# Patient Record
Sex: Male | Born: 1965 | Race: Black or African American | Hispanic: No | Marital: Married | Smoking: Former smoker
Health system: Southern US, Community
[De-identification: ages and names within clinical notes are randomized; demographics above are authoritative.]

## PROBLEM LIST (undated history)

## (undated) DIAGNOSIS — I10 Essential (primary) hypertension: Secondary | ICD-10-CM

## (undated) DIAGNOSIS — M1990 Unspecified osteoarthritis, unspecified site: Secondary | ICD-10-CM

## (undated) HISTORY — DX: Essential (primary) hypertension: I10

## (undated) HISTORY — DX: Unspecified osteoarthritis, unspecified site: M19.90

---

## 1997-11-08 ENCOUNTER — Emergency Department (HOSPITAL_COMMUNITY): Admission: EM | Admit: 1997-11-08 | Discharge: 1997-11-08 | Payer: Self-pay | Admitting: Emergency Medicine

## 2006-03-27 HISTORY — PX: OTHER SURGICAL HISTORY: SHX169

## 2006-04-22 ENCOUNTER — Emergency Department (HOSPITAL_COMMUNITY): Admission: EM | Admit: 2006-04-22 | Discharge: 2006-04-22 | Payer: Self-pay | Admitting: Emergency Medicine

## 2006-04-30 ENCOUNTER — Ambulatory Visit (HOSPITAL_COMMUNITY): Admission: RE | Admit: 2006-04-30 | Discharge: 2006-05-01 | Payer: Self-pay | Admitting: Orthopaedic Surgery

## 2008-05-12 IMAGING — CR DG ANKLE COMPLETE 3+V*R*
3 series · 3 of 3 positions shown · non-contrast
Comparison: none

CLINICAL DATA: Twisted ankle

Right ankle three-view:
Oblique minimally displaced fracture of the distal fibula at the level of the
ankle mortise. There is adjacent soft tissue swelling. Ankle mortise remains
intact. No other fracture or bone abnormality is evident.

[t ankle joint ap right]
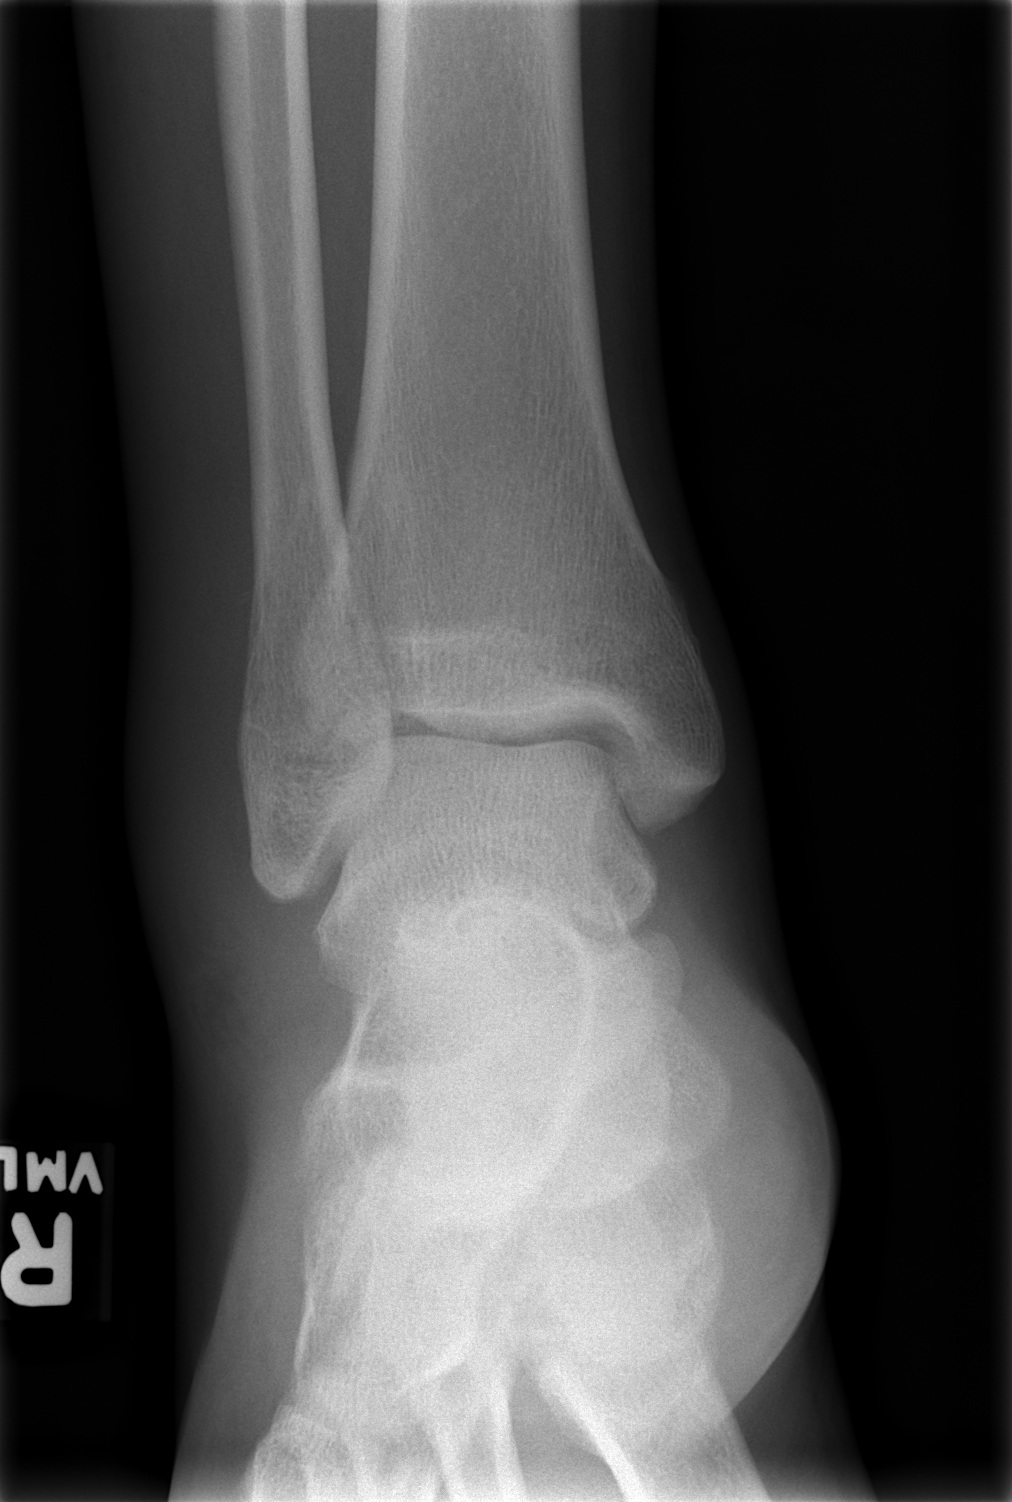

[t ankle joint oblique right]
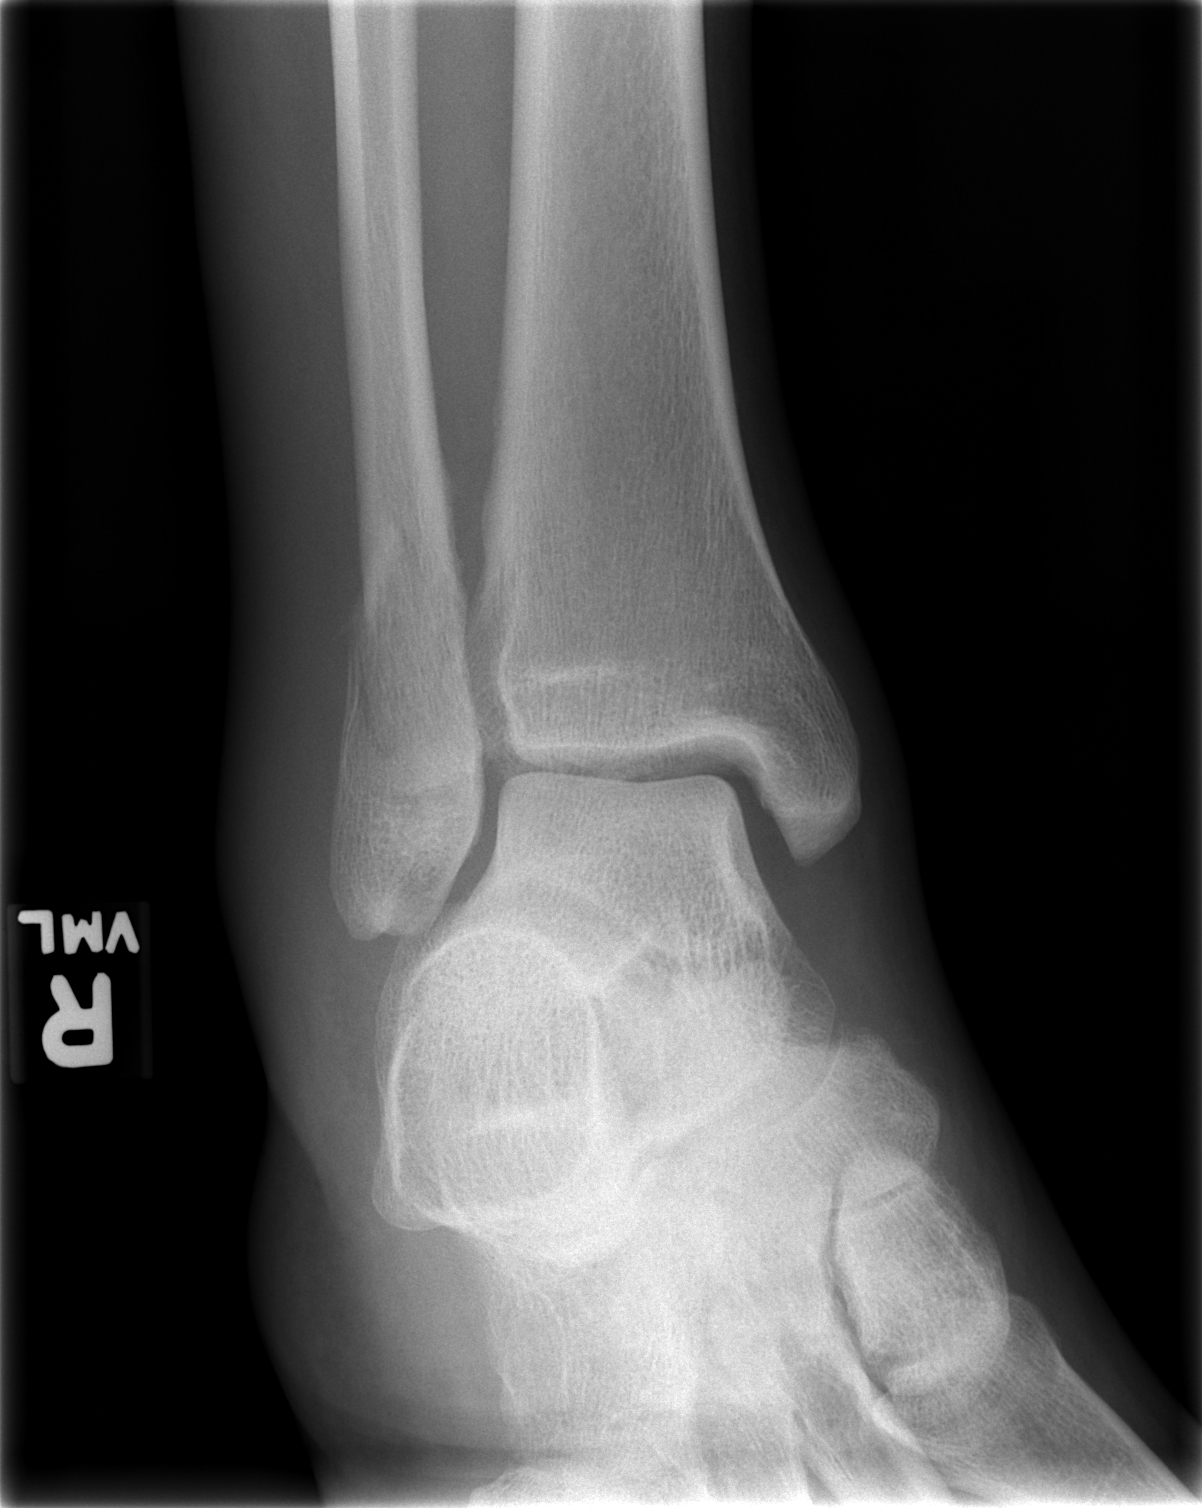

[t ankle joint lat right]
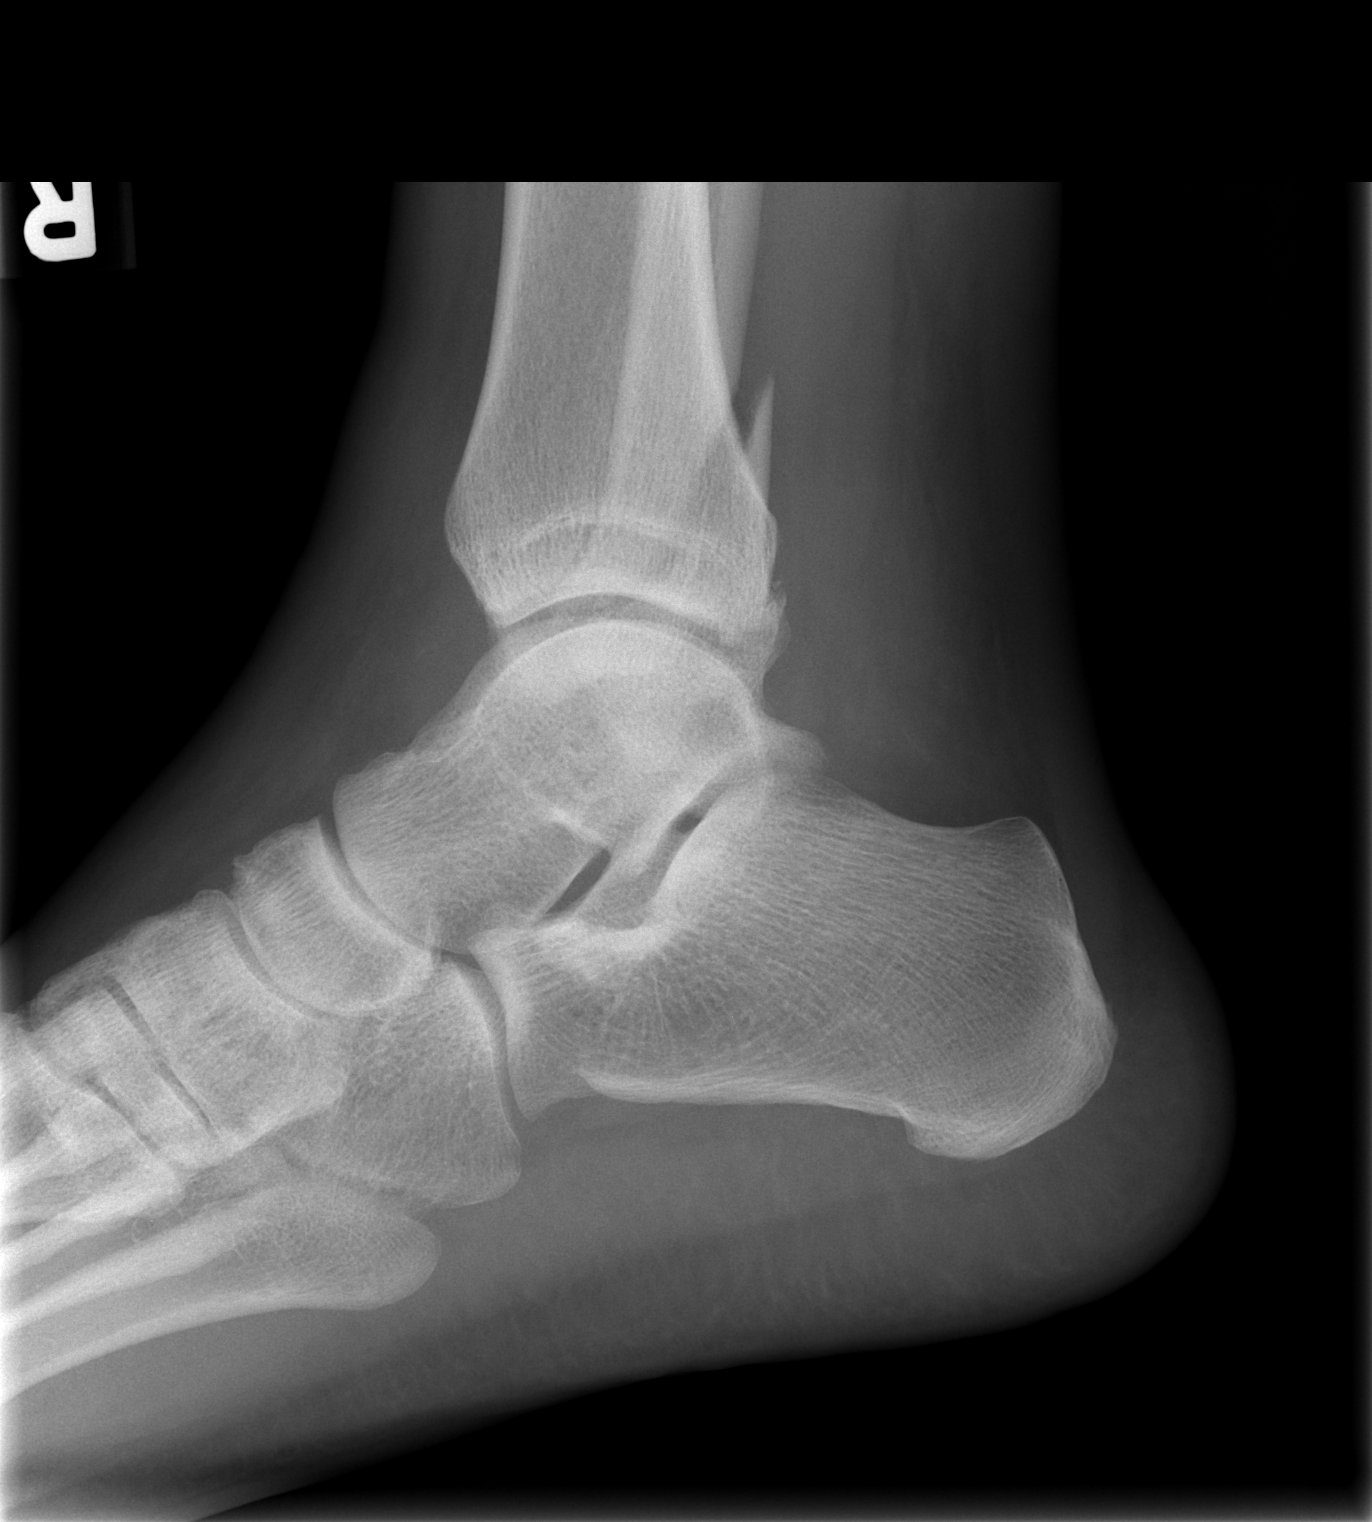

[3 of 3 positions shown; findings below may reference images not displayed]

IMPRESSION: 1. Distal fibular fracture

## 2009-03-27 DIAGNOSIS — I10 Essential (primary) hypertension: Secondary | ICD-10-CM

## 2009-03-27 HISTORY — DX: Essential (primary) hypertension: I10

## 2009-07-15 ENCOUNTER — Emergency Department (HOSPITAL_COMMUNITY): Admission: EM | Admit: 2009-07-15 | Discharge: 2009-07-15 | Payer: Self-pay | Admitting: Emergency Medicine

## 2009-10-25 HISTORY — PX: HERNIA REPAIR: SHX51

## 2009-11-22 ENCOUNTER — Ambulatory Visit (HOSPITAL_COMMUNITY): Admission: RE | Admit: 2009-11-22 | Discharge: 2009-11-22 | Payer: Self-pay | Admitting: General Surgery

## 2009-11-24 ENCOUNTER — Ambulatory Visit: Payer: Self-pay | Admitting: Internal Medicine

## 2009-11-24 DIAGNOSIS — I10 Essential (primary) hypertension: Secondary | ICD-10-CM | POA: Insufficient documentation

## 2009-12-14 ENCOUNTER — Ambulatory Visit: Payer: Self-pay | Admitting: Internal Medicine

## 2009-12-14 ENCOUNTER — Ambulatory Visit: Payer: Self-pay | Admitting: Cardiology

## 2009-12-14 ENCOUNTER — Telehealth: Payer: Self-pay | Admitting: Internal Medicine

## 2009-12-14 DIAGNOSIS — R079 Chest pain, unspecified: Secondary | ICD-10-CM | POA: Insufficient documentation

## 2009-12-14 LAB — CONVERTED CEMR LAB
BUN: 9 mg/dL (ref 6–23)
Chloride: 104 meq/L (ref 96–112)
Creatinine, Ser: 0.9 mg/dL (ref 0.4–1.5)
Glucose, Bld: 87 mg/dL (ref 70–99)
Sodium: 138 meq/L (ref 135–145)

## 2009-12-28 ENCOUNTER — Telehealth: Payer: Self-pay | Admitting: Internal Medicine

## 2009-12-31 ENCOUNTER — Telehealth: Payer: Self-pay | Admitting: Internal Medicine

## 2009-12-31 ENCOUNTER — Ambulatory Visit: Payer: Self-pay | Admitting: Internal Medicine

## 2009-12-31 DIAGNOSIS — B351 Tinea unguium: Secondary | ICD-10-CM

## 2009-12-31 DIAGNOSIS — M25519 Pain in unspecified shoulder: Secondary | ICD-10-CM

## 2009-12-31 DIAGNOSIS — M549 Dorsalgia, unspecified: Secondary | ICD-10-CM | POA: Insufficient documentation

## 2010-01-05 LAB — CONVERTED CEMR LAB
ALT: 37 units/L (ref 0–53)
Albumin: 4.7 g/dL (ref 3.5–5.2)
Alkaline Phosphatase: 66 units/L (ref 39–117)
Bilirubin, Direct: 0.1 mg/dL (ref 0.0–0.3)
Calcium: 9.5 mg/dL (ref 8.4–10.5)
Creatinine, Ser: 0.83 mg/dL (ref 0.40–1.50)
Glucose, Bld: 94 mg/dL (ref 70–99)
Indirect Bilirubin: 0.7 mg/dL (ref 0.0–0.9)
Total Bilirubin: 0.8 mg/dL (ref 0.3–1.2)
Total Protein: 7.2 g/dL (ref 6.0–8.3)

## 2010-01-07 ENCOUNTER — Telehealth: Payer: Self-pay | Admitting: Internal Medicine

## 2010-01-14 ENCOUNTER — Encounter: Payer: Self-pay | Admitting: Internal Medicine

## 2010-01-24 ENCOUNTER — Telehealth: Payer: Self-pay | Admitting: Internal Medicine

## 2010-01-27 ENCOUNTER — Telehealth: Payer: Self-pay | Admitting: Internal Medicine

## 2010-02-04 ENCOUNTER — Telehealth: Payer: Self-pay | Admitting: Internal Medicine

## 2010-03-09 ENCOUNTER — Ambulatory Visit: Payer: Self-pay | Admitting: Internal Medicine

## 2010-03-11 ENCOUNTER — Telehealth: Payer: Self-pay | Admitting: Internal Medicine

## 2010-03-15 ENCOUNTER — Telehealth: Payer: Self-pay | Admitting: Internal Medicine

## 2010-03-15 ENCOUNTER — Ambulatory Visit: Payer: Self-pay | Admitting: Internal Medicine

## 2010-04-17 ENCOUNTER — Encounter: Payer: Self-pay | Admitting: Internal Medicine

## 2010-04-26 NOTE — Progress Notes (Signed)
Summary: Sporanox denied  Phone Note Other Incoming   Summary of Call: Patient sporanox has been denied. Please advise. Initial call taken by: Lucious Groves CMA,  January 27, 2010 9:45 AM  Follow-up for Phone Call        again, refer to dermatology for further treatment. Follow-up by: Nolon Rod. Paz MD,  January 28, 2010 10:09 AM  Additional Follow-up for Phone Call Additional follow up Details #1::        Patient notified that prescription has been denied and he made me aware he has already seen dermatology. No further action required.  Additional Follow-up by: Lucious Groves CMA,  January 28, 2010 2:25 PM

## 2010-04-26 NOTE — Medication Information (Signed)
Summary: Prior Authorization for Sporanox/[employer]  Prior Authorization for Sporanox/[employer]   Imported By: Lanelle Bal 01/26/2010 12:40:58  _____________________________________________________________________  External Attachment:    Type:   Image     Comment:   External Document

## 2010-04-26 NOTE — Progress Notes (Signed)
Summary: BP Med SE  Phone Note Call from Patient Call back at Home Phone [phone]   Caller: Patient Summary of Call: Patient called and LM on triage VM stating that his BP medicine (Losartan Potassium 100mg ) was causing him GI problems and he would like to have the medicine changed. Initial call taken by: Harold Barban,  January 24, 2010 1:39 PM  Follow-up for Phone Call        Spoke with patient and he said he researched the medicine and said he had some side effects it stated this medicine could cause. 45-60 mins after taking medicine he gets bad abd cramps. Please advise.  Follow-up by: Harold Barban,  January 24, 2010 1:42 PM  Additional Follow-up for Phone Call Additional follow up Details #1::        please be sure he's not having any rash, lip or tongue swelling.  If he is not , then discontinue losartan, give samples of micardis 80 mg one p.o. q. day nurse visit in 3 weeks for a BP check and BMP.    Additional Follow-up by: Nolon Rod. Paz MD,  January 24, 2010 4:03 PM    Additional Follow-up for Phone Call Additional follow up Details #2::    left message for pt to call back. Army Fossa CMA  January 24, 2010 4:12 PM   Additional Follow-up for Phone Call Additional follow up Details #3:: Details for Additional Follow-up Action Taken: Pt is not having any swelling or rashes, he will stop by and get meds and come in for a BP check in 3 weeks.  Additional Follow-up by: Army Fossa CMA,  January 24, 2010 4:53 PM  New/Updated Medications: MICARDIS 80 MG TABS (TELMISARTAN) 1 by mouth daily.

## 2010-04-26 NOTE — Progress Notes (Signed)
Summary: PRIOR AUTH  Phone Note Refill Request Call back at [phone] Message from:  Pharmacy on January 07, 2010 3:35 PM  Refills Requested: Medication #1:  SPORANOX 100 MG CAPS 2 tablets daily for 12 weeks.   Dosage confirmed as above?Dosage Confirmed   Supply Requested: 1 month PRIOR AUTH NEEDED: [phone] ID [id number]  Next Appointment Scheduled: 01/28/10 Initial call taken by: Lavell Islam,  January 07, 2010 3:35 PM  Follow-up for Phone Call        Not available via [email or web address]. Called [employer] and was directed to call [employer] at [phone]. Forms requested Ref # [id number]. Follow-up by: Lucious Groves CMA,  January 10, 2010 9:31 AM  Additional Follow-up for Phone Call Additional follow up Details #1::        Forms requested again. Cesar Rogerson CMA  January 14, 2010 8:51 AM   Forms were completed and faxed on the 21st. Will await insurance company reply. Derrik Mceachern CMA  January 17, 2010 12:05 PM      Appended Document: PRIOR AUTH called to check on status, prior authorization still pending.

## 2010-04-26 NOTE — Progress Notes (Signed)
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 The findings are  mild  and there is no clear guidelines as to repeat a CT with contrast on this patient (and expose him to more radiation) . Because of this I like  to see the patient in 3 months, check a CBC and be sure he does not have nocturnal fevers, weight loss, cough et Karie Soda. I spoke with the patient, he is in agreement to come back within 3 months. At this point he is doing well and symptom free Jose E. Paz MD  December 28, 2009 9:23 AM

## 2010-04-26 NOTE — Assessment & Plan Note (Signed)
Summary: NEW TO ESTAB/BP PROBLEM//PH   Vital Signs:  Patient profile:   45 year old male Height:      71 inches Weight:      207 pounds BMI:     28.97 Pulse rate:   83 / minute Pulse rhythm:   regular BP sitting:   138 / 88  (left arm) Cuff size:   regular  Vitals Entered By: Army Fossa CMA (November 24, 2009 8:09 AM) CC: New to establish- BP Has been elevated Comments highest- 150/101  had hernia repair on Monday during surgery BP was 147/97   History of Present Illness: here with his wife He was diagnosed with hypertension several months ago For while he took Azor  with good results, his blood pressure was in the 120/80 range and he did not have side effects. He took all the samples he was provided and then stopped taking it. His blood pressure has been elevated recently several times. See nurse's notes  Labs from 11/18/09(pre-op) reviewed:    Sodium (NA)                              142               135-145          mEq/L  Potassium (K)                            4.0               3.5-5.1          mEq/L  Chloride                                 108               96-112           mEq/L  CO2                                      30                19-32            mEq/L  Glucose                                  88                70-99            mg/dL  BUN                                      4          l      6-23             mg/dL  Creatinine                               0.94              0.4-1.5  mg/dL    Bilirubin, Total                         1.0               0.3-1.2          mg/dL  Alkaline Phosphatase                     60                39-117           U/L  SGOT (AST)                               31                0-37             U/L  SGPT (ALT)                               35                0-53             U/L  Total  Protein                           6.8               6.0-8.3          g/dL  Albumin-Blood                            4.4                3.5-5.2          g/dL  Calcium                                  9.6                    WBC                                      3.6        l      4.0-10.5         K/uL  RBC                                      4.56              4.22-5.81        MIL/uL  Hemoglobin (HGB)                         13.1              13.0-17.0        g/dL  Hematocrit (HCT)  38.4       l      39.0-52.0        %  Platelet Count (PLT)                     162               150-400          K/uL  Preventive Screening-Counseling & Management  Alcohol-Tobacco     Smoking Status: never  Caffeine-Diet-Exercise     Does Patient Exercise: no      Drug Use:  no.    Allergies (verified): No Known Drug Allergies  Past History:  Past Medical History: Asthma--dx as a child, no symptoms x years  HTN--dx 2011 Hypertension  Past Surgical History: Hernia Repair 10-2009 Broken Ankle (had surgery) -2008  Family History:  Hypertension--  F M MI-- no CHF-- M Strokes-- M x2 DM -- M colon ca--no prostate ca--no  Social History: married 5 boys Never Smoked Alcohol use-no Drug use-no Regular exercise-no  Smoking Status:  never Drug Use:  no Does Patient Exercise:  no  Review of Systems CV:  Denies chest pain or discomfort and swelling of feet. Resp:  Denies cough and shortness of breath. GI:  Denies diarrhea, nausea, and vomiting. Neuro:  rarely has a HA.  Physical Exam  General:  alert, well-developed, and well-nourished.   Neck:  no masses and no thyromegaly.   Lungs:  normal respiratory effort, no intercostal retractions, no accessory muscle use, and normal breath sounds.   Heart:  normal rate, regular rhythm, no murmur, and no gallop.   Abdomen:  unable to lay down and the table due to recent surgery Extremities:  no lower extremity edema Psych:  Oriented X3, memory intact for recent and remote, normally interactive, good eye contact, not anxious appearing, and not  depressed appearing.     Impression & Recommendations:  Problem # 1:  HYPERTENSION (ICD-401.9)  hypertension diagnosed a few months ago who is BP in the 150s documented several times BP today is actually normal, he has been resting for the last 2 days due to recent surgery recent labs within normal Discussed the treatment of hypertension: -low-salt diet  -exercise -medication will start with  losartan 50 mg , watch for side effects and over controlled BP Follow up in 3 weeks. Consider add  amlodipine if needed BP today: 138/88  His updated medication list for this problem includes:    Losartan Potassium 50 Mg Tabs (Losartan potassium) ..... One by mouth daily  Complete Medication List: 1)  Norco 5-325 Mg Tabs (Hydrocodone-acetaminophen) .... Q4 hrs. 2)  Losartan Potassium 50 Mg Tabs (Losartan potassium) .... One by mouth daily  Patient Instructions: 1)  Please schedule a follow-up appointment in 3  weeks 2)  call if you feel weak, dizzy. 3)  Check your blood pressure 2 or 3 times a week. If it is more than 140/85  or less than  110/60 consistently,please let us know  Prescriptions: LOSARTAN POTASSIUM 50 MG TABS (LOSARTAN POTASSIUM) one by mouth daily  #30 x 1   Entered and Authorized by:   Elita Quick E. Paz MD   Signed by:   Nolon Rod. Paz MD on 11/24/2009   Method used:   Print then Give to Patient   RxID:   [id number]    Preventive Care Screening  Last Tetanus Booster:    Date:  03/28/2003    Results:  Historical

## 2010-04-26 NOTE — Assessment & Plan Note (Signed)
Summary: bp check/shoulder pain/cbs   Vital Signs:  Patient profile:   45 year old male Weight:      203 pounds Pulse rate:   107 / minute Pulse rhythm:   regular BP sitting:   132 / 84  (left arm) Cuff size:   regular  Vitals Entered By: Army Fossa CMA (December 31, 2009 1:30 PM) CC: BP check, discuss shoulder pain Comments pain off and on for years [facility] [address] declines flu shot   History of Present Illness: routine followup  We recently increased his BP medicine  good medication compliance, no side effects that he can tell, ambulatory BPs in the 120/70. Feels great  Also has onychomycosis for years, has tried all over-the-counter medicines without defect. Likes to be treated  Chronic right shoulder pain for years, he feels a "pop" when he elevates his arm  Chronic low back pain for years as well, pain is worse when he lifts heavy stuff, better with rest, occ.  he has radiation and pain in the left leg. In the past he was evaluated ,a  MRI was done, surgery was recommended but he never followed through. He is tired of the pain and like something done   ROS No fever No bladder or bowel incontinence  Allergies: No Known Drug Allergies  Past History:  Past Medical History: Reviewed history from 12/14/2009 and no changes required. Asthma--dx as a child, no symptoms x years  HTN--dx 2011  Past Surgical History: Reviewed history from 11/24/2009 and no changes required. Hernia Repair 10-2009 Broken Ankle (had surgery) -2008  Social History: Reviewed history from 11/24/2009 and no changes required. married 5 boys Never Smoked Alcohol use-no Drug use-no Regular exercise-no  Physical Exam  General:  alert, well-developed, and well-nourished.   Lungs:  normal respiratory effort, no intercostal retractions, no accessory muscle use, and normal breath sounds.   Heart:  normal rate, regular rhythm, and no murmur.   Msk:  slightly tender at the lower  back Extremities:  shoulders without deformities Pain of the right shoulder with elevation of the arm Neurologic:  lower extremity DTRs and motor: Symmetric No straight leg test Skin:  both great toe nails  quite  thick and dystrophic, the other nails are essentially unaffected. No maceration between the toes   Impression & Recommendations:  Problem # 1:  HYPERTENSION (ICD-401.9) BP well controlled, continue with present care His updated medication list for this problem includes:    Losartan Potassium 100 Mg Tabs (Losartan potassium) ..... One by mouth daily  BP today: 132/84 Prior BP: 140/88 (12/14/2009)  Labs Reviewed: K+: 3.9 (12/14/2009) Creat: : 0.9 (12/14/2009)     Orders: Venipuncture (09811)  Problem # 2:  ONYCHOMYCOSIS (ICD-110.1) dystrophic nails most likely onychomycosis Check LFTs Start Sporanox daily for 12 weeks aware of potential liver problems, printed material provided regards Sporanox from "up-to-date" It may affect losartan, he needs to monitor his BP   His updated medication list for this problem includes:    Sporanox 100 Mg Caps (Itraconazole) .Marland Kitchen... 2 tablets daily for 12 weeks  Problem # 3:  SHOULDER PAIN (ICD-719.41)  chronic pain, refer to orthopedic surgery The following medications were removed from the medication list:    Norco 5-325 Mg Tabs (Hydrocodone-acetaminophen) ..... Q4 hrs.  Orders: Orthopedic Referral (Ortho)  Problem # 4:  BACK PAIN (ICD-724.5)  chronic pain, some radicular features, in the past he had MRI, was offered surgery but did not follow through. Refer to orthopedic surgery  The following medications were removed from the medication list:    Norco 5-325 Mg Tabs (Hydrocodone-acetaminophen) ..... Q4 hrs.  Orders: Orthopedic Referral (Ortho)  Complete Medication List: 1)  Losartan Potassium 100 Mg Tabs (Losartan potassium) .... One by mouth daily 2)  Sporanox 100 Mg Caps (Itraconazole) .... 2 tablets daily for 12  weeks  Patient Instructions: 1)  start Sporanox 2 tablets daily for 3 months 2)  came back  4 weeks after you start sporanox: LFTs , dx onychomicosis 3)  Check your blood pressure 2 or 3 times a week. If it is more than 140/85 consistently,please let us know  4)  Please schedule a follow-up appointment in 4 months .  Prescriptions: LOSARTAN POTASSIUM 100 MG TABS (LOSARTAN POTASSIUM) one by mouth daily  #90 x 2   Entered and Authorized by:   Elita Quick E. Paz MD   Signed by:   Nolon Rod. Paz MD on 12/31/2009   Method used:   Print then Give to Patient   RxID:   [id number] SPORANOX 100 MG CAPS (ITRACONAZOLE) 2 tablets daily for 12 weeks  #60 x 2   Entered and Authorized by:   Nolon Rod. Paz MD   Signed by:   Nolon Rod. Paz MD on 12/31/2009   Method used:   Print then Give to Patient   RxID:   [id number]

## 2010-04-26 NOTE — Progress Notes (Signed)
Summary: Med Concerns  Phone Note Call from Patient Call back at Home Phone [phone]   Caller: Patient Summary of Call: Patient called to say that his insurance will not cover Sporanox and he needs an alternative  Dr.Paz please advise [facility] [address] Initial call taken by: Shonna Chock CMA,  December 31, 2009 3:41 PM  Follow-up for Phone Call        Per MD refer to dermatology. Patient notified. Paola Flynt CMA  December 31, 2009 3:50 PM   New Problems: ONYCHOMYCOSIS (ICD-110.1)   New Problems: ONYCHOMYCOSIS (ICD-110.1)

## 2010-04-26 NOTE — Assessment & Plan Note (Signed)
Summary: 3 WEEK FOLLOWUP/KN   Vital Signs:  Patient profile:   45 year old male Weight:      201.50 pounds Pulse rate:   98 / minute Pulse rhythm:   regular BP sitting:   140 / 88  (left arm) Cuff size:   regular  Vitals Entered By: Army Fossa CMA (December 14, 2009 10:59 AM) CC: Follow up on BP- fasting Comments pharm- [facility] [address] declines flu shot   History of Present Illness: followup As far as the hypertension, he started losartan, good  medication compliance and tolerance. Ambulatory BPs in the 140/86 range No side effects that he can tell  Also complained of chest pain, last week he had a steady anterior chest pain without radiation for 3 days The pain was worse with deep breaths or moving his torso Pain is now resolved. There was no associated nausea, vomiting, difficulty breathing.  ROS following a low-salt diet He had recently a hernia repair, still quite sore in the area, some swelling. He just saw surgery, and they recommend 3 more weeks of resting and limited  lifting No fever  no wheezing or cough No pain or swelling in his calves  admits to  some heartburn last week No palpitations   Current Medications (verified): 1)  Norco 5-325 Mg Tabs (Hydrocodone-Acetaminophen) .... Q4 Hrs. 2)  Losartan Potassium 50 Mg Tabs (Losartan Potassium) .... One By Mouth Daily  Allergies (verified): No Known Drug Allergies  Past History:  Past Medical History: Asthma--dx as a child, no symptoms x years  HTN--dx 2011  Past Surgical History: Reviewed history from 11/24/2009 and no changes required. Hernia Repair 10-2009 Broken Ankle (had surgery) -2008  Social History: Reviewed history from 11/24/2009 and no changes required. married 5 boys Never Smoked Alcohol use-no Drug use-no Regular exercise-no  Physical Exam  General:  alert, well-developed, and well-nourished.   Chest Wall:  nontender to palpation Lungs:  normal respiratory effort,  no intercostal retractions, no accessory muscle use, and normal breath sounds.   Heart:  normal rate, regular rhythm, and no murmur.   Abdomen:  soft, non-tender, no distention, and no masses.   Extremities:  no lower extremity edema, calves  are soft, symmetric, not tender   Impression & Recommendations:  Problem # 1:  HYPERTENSION (ICD-401.9) close to his goal, I would like to see his BP closer to 120 Increase losartan to 100 mg Labs His updated medication list for this problem includes:    Losartan Potassium 100 Mg Tabs (Losartan potassium) ..... One by mouth daily  BP today: 140/88 Prior BP: 138/88 (11/24/2009)  Orders: Venipuncture (14782) TLB-BMP (Basic Metabolic Panel-BMET) (80048-METABOL) Specimen Handling (95621)  Problem # 2:  CHEST PAIN (ICD-786.50)  three-day history of chest pain last week, status post surgery last month, he has been resting more than usual DDX includes CAD, PE, GERD, others pain is now  resolved Plan: EKG--normal sinus rhythm, normal axis, no acute changes; early repolarization? ( also seen at the EKG at the hospital on August) Chest x-ray D-dimer  ER if symptoms resurface  Orders: T-D-Dimer Fibrin Derivatives Quantitive (30865-78469) EKG w/ Interpretation (93000) Specimen Handling (62952) T-2 View CXR (71020TC) Radiology Referral (Radiology)  Complete Medication List: 1)  Norco 5-325 Mg Tabs (Hydrocodone-acetaminophen) .... Q4 hrs. 2)  Losartan Potassium 100 Mg Tabs (Losartan potassium) .... One by mouth daily  Patient Instructions: 1)  increase losartan 2)  get a chest x-ray 3)  came back in one month Prescriptions: LOSARTAN POTASSIUM  100 MG TABS (LOSARTAN POTASSIUM) one by mouth daily  #30 x 3   Entered and Authorized by:   Elita Quick E. Shone Leventhal MD   Signed by:   Nolon Rod. Temesgen Weightman MD on 12/14/2009   Method used:   Print then Give to Patient   RxID:   [id number]

## 2010-04-26 NOTE — Progress Notes (Signed)
Summary: CT Results   Phone Note Outgoing Call   Summary of Call: advise  patient, CT negative for pulmonary emboli. Recommend observation. To call if chest pain re-surface [name] [name] MD  December 14, 2009 4:19 PM   Follow-up for Phone Call        Left message for pt to call back. Army Fossa CMA  December 14, 2009 4:23 PM   Additional Follow-up for Phone Call Additional follow up Details #1::        Patient is aware. Will call back if chest pain comes back. Additional Follow-up by: Harold Barban,  December 15, 2009 10:51 AM

## 2010-04-26 NOTE — Progress Notes (Signed)
Summary: Triage Call- BP Medication  Phone Note Call from Patient   Caller: Patient Summary of Call: Left message on Triage stating that he was originally on a BP Med that didn't work and gave him stomach pain. The 2nd medication makes him sleepy " I just can't function." Pt is requesting to be put back on the original BP medication and then if he has stomach pain he will need something for that. Please advise. [phone] Initial call taken by: Lavell Islam,  February 04, 2010 2:15 PM  Follow-up for Phone Call        Left message for pt to call back, pt did not answer. Unable to get more information. Will try on Monday. Follow-up by: Army Fossa CMA,  February 04, 2010 5:07 PM  Additional Follow-up for Phone Call Additional follow up Details #1::        Patient notes that the above is correct, the original BP med (losartan) gave him stomach pain/cramps. Micardis makes him groggy. Please advise of alternative.   (uses [facility] [address]). Patient notes that he did not have any problems with Azor. Additional Follow-up by: Lucious Groves CMA,  February 07, 2010 2:05 PM    Additional Follow-up for Phone Call Additional follow up Details #2::    go back to azor 5-20mg  1 a day, call #30, 1 RF ov 4 weeks Magdalina Whitehead E. Matheau Orona MD  February 07, 2010 4:59 PM    Patient notified and appt scheduled. Criston Chancellor CMA  February 08, 2010 8:47 AM   New/Updated Medications: AZOR 5-20 MG TABS (AMLODIPINE-OLMESARTAN) 1 by mouth once daily Prescriptions: AZOR 5-20 MG TABS (AMLODIPINE-OLMESARTAN) 1 by mouth once daily  #30 x 1   Entered by:   Lucious Groves CMA   Authorized by:   Nolon Rod. Sabri Teal MD   Signed by:   Lucious Groves CMA on 02/08/2010   Method used:   Electronically to        [id number] (retail)       [address] [place], [place]  [address]       Ph: [phone]       Fax: [phone]   RxID:   [id number]

## 2010-04-28 NOTE — Progress Notes (Signed)
Summary: Missed appt  Phone Note Call from Patient Call back at Home Phone [phone]   Summary of Call: Patient has not shown up for appt. I called to check on him, left message on voicemail to call back to office.  Lucious Groves CMA,  March 15, 2010 1:08 PM  No return call, left message on voicemail to call back to office to reschedule. Christepher Melchior CMA  March 16, 2010 2:39 PM   FYI--No return call and patient has not re-scheduled appt. Sabir Charters CMA  March 18, 2010 4:03 PM

## 2010-04-28 NOTE — Progress Notes (Signed)
Summary: Call request--Discuss personal issue  Phone Note Call from Patient Call back at Home Phone [phone]   Summary of Call: Patient is having some very personal issues that are tearing up his nerves. I offered patient 2 different appts today (one with [facility] or with [facility]) and patient declined stating that he has to work. I did schedule appt next week with Talena Neira.   Patient made me aware that he is having a great deal of stress with family issue, and is having HA, but I could not get any additional info from him to be able to assist him better. Patient would like MD to call him on the phone to discuss the issues b/c he feels that he cannot wait until next week. Initial call taken by: Lucious Groves CMA,  March 11, 2010 10:34 AM  Follow-up for Phone Call        patient quite distressed, his son is having problems at school. Denies suicidal or homicidal ideas. Patient is counseled knows to call if he ever develops suicidal or homicidal ideas We'll call  clonazepam 0.5 mg Half to one tablet 3 times a day as needed for anxiety #30 send Rx to his pharmacy  will come back Follow-up by: Assata Juncaj E. Nahima Ales MD,  March 11, 2010 10:54 AM  Additional Follow-up for Phone Call Additional follow up Details #1::        Completed. Additional Follow-up by: Lucious Groves CMA,  March 11, 2010 11:24 AM    New/Updated Medications: CLONAZEPAM 0.5 MG TBDP (CLONAZEPAM) Half to one tablet 3 times a day as needed for anxiety Prescriptions: CLONAZEPAM 0.5 MG TBDP (CLONAZEPAM) Half to one tablet 3 times a day as needed for anxiety  #30 x 0   Entered by:   Lucious Groves CMA   Authorized by:   Nolon Rod. Khameron Gruenwald MD   Signed by:   Lucious Groves CMA on 03/11/2010   Method used:   Printed then faxed to ...       [facility] [address] #[id number]* (retail)       [address] [place], [place]  [address]       Ph: [phone]       Fax: [phone]   RxID:   [id number]

## 2010-05-27 ENCOUNTER — Ambulatory Visit: Payer: BC Managed Care – PPO | Admitting: Internal Medicine

## 2010-05-27 ENCOUNTER — Ambulatory Visit (INDEPENDENT_AMBULATORY_CARE_PROVIDER_SITE_OTHER): Payer: BC Managed Care – PPO | Admitting: Internal Medicine

## 2010-05-27 ENCOUNTER — Encounter: Payer: Self-pay | Admitting: Internal Medicine

## 2010-05-27 ENCOUNTER — Telehealth (INDEPENDENT_AMBULATORY_CARE_PROVIDER_SITE_OTHER): Payer: Self-pay | Admitting: *Deleted

## 2010-05-27 DIAGNOSIS — M25519 Pain in unspecified shoulder: Secondary | ICD-10-CM

## 2010-06-02 NOTE — Assessment & Plan Note (Signed)
Summary: shoulder pain   Vital Signs:  Patient profile:   45 year old male Weight:      201.4 pounds BMI:     28.19 Temp:     98.8 degrees F oral Pulse rate:   72 / minute Resp:     15 per minute BP sitting:   140 / 98  (left arm) Cuff size:   large  Vitals Entered By: Shonna Chock CMA (May 27, 2010 12:00 PM) CC: 1.) Right shoulder pain  2.)Asthma, Shoulder pain, Neck pain   CC:  1.) Right shoulder pain  2.)Asthma, Shoulder pain, and Neck pain.  History of Present Illness:      This is a 45 year old man who presents with Shoulder pain progressive over past 3 months  The patient reports numbness, weakness, &  tingling RUE > LUE .He has  R shouder stiffness, impaired ROM, and swelling, but denies redness.  The pain is located mainly  in the right shoulder.  The pain began gradually  12 months ago  after lifting 35-40  # rolls of plastic film.  The patient describes the pain as constant, dull, and burning for last 3 months.  The pain is better with NSAIDS.  The pain is worse in RLDP or with elevation of RUE.  There has been no evaluation to date. The patient denies the following associated symptoms: gait disturbance, fever, bladder dysfunction, and bowel dysfunction. He has been a weight lifter.   Current Medications (verified): 1)  Sporanox 100 Mg Caps (Itraconazole) .... 2 Tablets Daily For 12 Weeks  Allergies (verified): 1)  ! Benadryl  Physical Exam  General:  Well-developed, uncomfortable but in no acute distress; alert,appropriate and cooperative throughout examination Neck:  No deformities, masses, or tenderness noted. Some decreased ROM to R Lungs:  Normal respiratory effort, chest expands symmetrically. Lungs are clear to auscultation, no crackles or wheezes. Heart:  Normal rate and regular rhythm. S1 and S2 normal without gallop, murmur, click, rub or other extra sounds. Msk:  No deformity or scoliosis noted of thoracic or lumbar spine.   Pulses:  R and L carotid,radial  pulses are full and equal bilaterally Extremities:  No clubbing, cyanosis, edema, or deformity noted . Markedly decreased  range of motion of  R shoulder . R shoulder is lower than L with ? slight atrophy . Pain to palpation R shoulder  Neurologic:  alert & oriented X3, strength decreased R shoulder to opposition  ; DTRs symmetrical and normal.   Skin:  Intact without suspicious lesions or rashes Cervical Nodes:  No lymphadenopathy noted Axillary Nodes:  No palpable lymphadenopathy Psych:  memory intact for recent and remote, normally interactive, and good eye contact.     Impression & Recommendations:  Problem # 1:  SHOULDER PAIN, RIGHT (ICD-719.41)  acute on chronic  Orders: Orthopedic Surgeon Referral (Ortho Surgeon)  His updated medication list for this problem includes:    Hydrocodone-acetaminophen 5-500 Mg Tabs (Hydrocodone-acetaminophen) .Marland Kitchen... 1-2 every 6 hrs as needed pain  Complete Medication List: 1)  Sporanox 100 Mg Caps (Itraconazole) .... 2 tablets daily for 12 weeks 2)  Hydrocodone-acetaminophen 5-500 Mg Tabs (Hydrocodone-acetaminophen) .Marland Kitchen.. 1-2 every 6 hrs as needed pain  Patient Instructions: 1)  No weight lifting until cleared by Orthopedist Prescriptions: HYDROCODONE-ACETAMINOPHEN 5-500 MG TABS (HYDROCODONE-ACETAMINOPHEN) 1-2 every 6 hrs as needed pain  #30 x 0   Entered and Authorized by:   Marga Melnick MD   Signed by:   Marga Melnick MD  on 05/27/2010   Method used:   Print then Give to Patient   RxID:   [id number]    Orders Added: 1)  Est. Patient Level III [86578] 2)  Orthopedic Surgeon Referral [Ortho Surgeon]

## 2010-06-07 NOTE — Progress Notes (Signed)
Summary: inhaler (lmom 3/5)   Phone Note Call from Patient Call back at [facility] Phone [phone]   Summary of Call: Pt would like to know if he could get an inhaler to help w/ his Asthma. He states this is problem he has had for a while. Pt states this is not urget. Okay to call Monday. Army Fossa CMA  May 27, 2010 4:48 PM  [facility]  Follow-up for Phone Call        ventolin 2 puffs every 6 hours as needed wheezing #1, 1 Rf. If needs > 4 times a week, needs OV for evaluation Jose E. Paz MD  May 29, 2010 10:26 AM   Additional Follow-up for Phone Call Additional follow up Details #1::        Left message for pt to call back. Army Fossa CMA  May 30, 2010 8:23 AM     Additional Follow-up for Phone Call Additional follow up Details #2::    spoke w/ patient aware of information and appt scheduled to f/u ........Marland KitchenDoristine Devoid CMA  May 30, 2010 3:38 PM   New/Updated Medications: VENTOLIN HFA 108 (90 BASE) MCG/ACT AERS (ALBUTEROL SULFATE) 2 puffs every 6 hours as needed wheezing Prescriptions: VENTOLIN HFA 108 (90 BASE) MCG/ACT AERS (ALBUTEROL SULFATE) 2 puffs every 6 hours as needed wheezing  #1 x 1   Entered by:   Army Fossa CMA   Authorized by:   Nolon Rod. Paz MD   Signed by:   Army Fossa CMA on 05/30/2010   Method used:   Electronically to        [facility] [address] [phone]* (retail)       [address] [place], [place]  [address]       Ph: [phone]       Fax: [phone]   RxID:   [id number]

## 2010-06-08 ENCOUNTER — Ambulatory Visit: Payer: BC Managed Care – PPO | Admitting: Internal Medicine

## 2010-06-10 LAB — COMPREHENSIVE METABOLIC PANEL
ALT: 35 U/L (ref 0–53)
AST: 31 U/L (ref 0–37)
Albumin: 4.4 g/dL (ref 3.5–5.2)
Alkaline Phosphatase: 60 U/L (ref 39–117)
Creatinine, Ser: 0.94 mg/dL (ref 0.4–1.5)
Glucose, Bld: 88 mg/dL (ref 70–99)
Potassium: 4 mEq/L (ref 3.5–5.1)
Total Protein: 6.8 g/dL (ref 6.0–8.3)

## 2010-06-10 LAB — CBC
Hemoglobin: 13.1 g/dL (ref 13.0–17.0)
MCHC: 34.1 g/dL (ref 30.0–36.0)

## 2010-06-10 LAB — DIFFERENTIAL
Basophils Absolute: 0 10*3/uL (ref 0.0–0.1)
Basophils Relative: 0 % (ref 0–1)
Eosinophils Absolute: 0 10*3/uL (ref 0.0–0.7)
Eosinophils Relative: 1 % (ref 0–5)
Lymphocytes Relative: 27 % (ref 12–46)
Monocytes Relative: 18 % — ABNORMAL HIGH (ref 3–12)

## 2010-07-07 ENCOUNTER — Other Ambulatory Visit: Payer: Self-pay | Admitting: Internal Medicine

## 2010-07-07 NOTE — Telephone Encounter (Signed)
Ok #30, no RF 

## 2010-07-10 ENCOUNTER — Emergency Department (HOSPITAL_COMMUNITY): Payer: BC Managed Care – PPO

## 2010-07-10 ENCOUNTER — Emergency Department (HOSPITAL_COMMUNITY)
Admission: EM | Admit: 2010-07-10 | Discharge: 2010-07-10 | Disposition: A | Discharge status: Home / Self Care | Payer: BC Managed Care – PPO | Attending: Emergency Medicine | Admitting: Emergency Medicine

## 2010-07-10 DIAGNOSIS — R079 Chest pain, unspecified: Secondary | ICD-10-CM | POA: Insufficient documentation

## 2010-07-10 DIAGNOSIS — R05 Cough: Secondary | ICD-10-CM | POA: Insufficient documentation

## 2010-07-10 DIAGNOSIS — R059 Cough, unspecified: Secondary | ICD-10-CM | POA: Insufficient documentation

## 2010-07-10 DIAGNOSIS — J40 Bronchitis, not specified as acute or chronic: Secondary | ICD-10-CM | POA: Insufficient documentation

## 2010-07-10 DIAGNOSIS — R000 Tachycardia, unspecified: Secondary | ICD-10-CM | POA: Insufficient documentation

## 2010-07-11 ENCOUNTER — Telehealth: Payer: Self-pay | Admitting: *Deleted

## 2010-07-11 NOTE — Telephone Encounter (Signed)
[facility] Triage Call Report Triage Record Num: [id number] Operator: Caswell Corwin Patient Name: Tom Lee Call Date & Time: 07/09/2010 8:01:52AM Patient Phone: [phone] PCP: Patient Gender: Male PCP Fax : Patient DOB: 29-Sep-1965 Practice Name: [facility] Reason for Call: Pt calling that he has had cough and congeston that started 07/07/10 and now has a sore throat. Afebrile. Triaged Sore thorat/hoarseness and has swollen glands under his jaw. Home care and call back inst given. Inst needs to be evaluated at [facility] in the next 24 hrs. Pt states he did not want to spend money and "I know it's the flu" so he will call for an appt 07/11/10. Protocol(s) Used: Sore Throat or Hoarseness Recommended Outcome per Protocol: See Provider within 24 hours Reason for Outcome: New onset of painful, swollen glands on sides of neck or under jaw Care Advice: ~ Swollen lymph glands that persist more than two weeks must be evaluated by provider. ~ Stay home until your temperature returns to normal. Apply warm, moist soaks or compresses to the affected area for 20-30 minutes 3 to 4 times per day. Avoid burning skin by using water no hotter than bath water and by not lying on the compresses. ~ ~ SYMPTOM / CONDITION MANAGEMENT Most adults need to drink 6-10 eight-ounce glasses (1.2-2.0 liters) of fluids per day unless previously told to limit fluid intake for other medical reasons. Limit fluids that contain caffeine, sugar or alcohol. Urine will be a very light yellow color when you drink enough fluids. ~ Analgesic/Antipyretic Advice - Acetaminophen: Consider acetaminophen as directed on label or by pharmacist/provider for pain or fever PRECAUTIONS: - Use if there is no history of liver disease, alcoholism, or intake of three or more alcohol drinks per day - Only if approved by provider during pregnancy or when breastfeeding - During pregnancy, acetaminophen should not be taken more than  3 consecutive days without telling provider - Do not exceed recommended dose or frequency ~ Sore Throat Relief: - Use warm salt water gargles 3 to 4 times/day, as needed (1/2 tsp. salt in 8 oz. [.2 liters] water). - Suck on hard candy, nonprescription or herbal throat lozenges (sugar-free if diabetic) - Eat soothing, soft food/fluids (broths, soups, or honey and lemon juice in hot tea, Popsicles, frozen yogurt or sherbet, scrambled eggs, cooked cereals, Jell-O or puddings) whichever is most comforting. - Avoid eating salty, spicy or acidic foods.

## 2010-07-11 NOTE — Telephone Encounter (Signed)
Left msg on voicemail.

## 2010-07-22 ENCOUNTER — Other Ambulatory Visit: Payer: Self-pay | Admitting: Internal Medicine

## 2010-07-22 NOTE — Telephone Encounter (Signed)
Medication prescribed for shoulder pain. Okay to call #30, no refills. Advise patient that if pain persists, he needs to be referred to orthopedic surgery

## 2010-07-22 NOTE — Telephone Encounter (Signed)
Left message for pt to call back  °

## 2010-07-25 NOTE — Telephone Encounter (Signed)
Message left for patient to return my call.  

## 2010-07-26 NOTE — Telephone Encounter (Signed)
Message left for patient to return my call.  Unable to get in touch w/ pt-- anything additional you would like for me to do?

## 2010-07-28 ENCOUNTER — Encounter: Payer: Self-pay | Admitting: Internal Medicine

## 2010-08-01 ENCOUNTER — Ambulatory Visit: Payer: BC Managed Care – PPO | Admitting: Internal Medicine

## 2010-08-12 NOTE — Op Note (Signed)
NAME:  Tom Lee, Tom Lee NO.:  [id number]   MEDICAL RECORD NO.:  [id number]          PATIENT TYPE:  OIB   LOCATION:  5017                         FACILITY:  [facility]   PHYSICIAN:  Mark C. Ophelia Charter, M.D.    DATE OF BIRTH:  07/31/1965   DATE OF PROCEDURE:  04/30/2006  DATE OF DISCHARGE:                               OPERATIVE REPORT   PREOPERATIVE DIAGNOSIS:  Right lateral malleus fracture.   POSTOPERATIVE DIAGNOSIS:  Right lateral malleus fracture.   PROCEDURE:  Open reduction, internal fixation, right lateral malleolus.   SURGEON:  Mark C. Ophelia Charter, M.D.   ANESTHESIA:  GOT, plus Marcaine skin local.   DESCRIPTION OF PROCEDURE:  After induction of general anesthesia and  orotracheal intubation, preoperative Ancef prophylaxis, standard  prepping and draping with a bump underneath the right buttock, a sterile  skin marker was used and a lateral incision was made.  Subperiosteal  dissection of the fibula was performed.  The fracture was exposed.  Hematoma was removed with distraction, using a self-retaining clamp  proximally and a towel clip distally.  Once the fracture was cleaned  out, it was able to be reduced and held anatomically with a Verbrugge  clamp.  A 1/3 tubular 6-hole plate was selected (Synthes) from the small  fragment set.  Cortical screws, 14 mm, were placed in the proximal 3  holes, bicortical, distal screws were placed, cancellous, 14 mm,  unicortical.  Interfragmentary screw was used, which was 22 mm from  anterior proximal to distal posteriorly, and checked under fluoroscopy.  Anatomic position with reduction of the mortise.  After irrigation with  saline solution, the tourniquet was deflated.  Tourniquet time was 17  minutes.  Subcutaneous tissue was reapproximated with 2-0 Vicryl.  Skin  staple closure.  Marcaine infiltration, a total of 10 mL in the skin and  in the fracture site.  A postop dressing and short-leg splint were  applied, and transferred  to the recovery room in stable condition.  The  patient will stay overnight for IV pain medication and physical therapy,  crutch ambulation, and will be discharged in the morning.      Mark C. Ophelia Charter, M.D.  Electronically Signed     MCY/MEDQ  D:  04/30/2006  T:  04/30/2006  Job:  [id number]

## 2010-09-27 ENCOUNTER — Other Ambulatory Visit: Payer: Self-pay | Admitting: Internal Medicine

## 2010-09-27 NOTE — Telephone Encounter (Signed)
Last refilled 07/22/10

## 2010-09-29 NOTE — Telephone Encounter (Signed)
Left message for pt to call back  °

## 2010-09-29 NOTE — Telephone Encounter (Signed)
30, no RF. If shoulder , back pain persistent, needs to see ortho

## 2010-11-04 ENCOUNTER — Ambulatory Visit (INDEPENDENT_AMBULATORY_CARE_PROVIDER_SITE_OTHER): Payer: BC Managed Care – PPO | Admitting: Internal Medicine

## 2010-11-04 ENCOUNTER — Other Ambulatory Visit: Payer: Self-pay | Admitting: Internal Medicine

## 2010-11-04 ENCOUNTER — Encounter: Payer: Self-pay | Admitting: Internal Medicine

## 2010-11-04 VITALS — BP 126/90

## 2010-11-04 DIAGNOSIS — R51 Headache: Secondary | ICD-10-CM

## 2010-11-04 DIAGNOSIS — R319 Hematuria, unspecified: Secondary | ICD-10-CM

## 2010-11-04 DIAGNOSIS — M545 Low back pain: Secondary | ICD-10-CM

## 2010-11-04 DIAGNOSIS — M546 Pain in thoracic spine: Secondary | ICD-10-CM

## 2010-11-04 LAB — POCT URINALYSIS DIPSTICK
Glucose, UA: NEGATIVE
Leukocytes, UA: NEGATIVE
Nitrite, UA: NEGATIVE
Protein, UA: NEGATIVE
Spec Grav, UA: 0.015
Urobilinogen, UA: 0.2

## 2010-11-04 MED ORDER — CYCLOBENZAPRINE HCL 5 MG PO TABS: 5.0000 mg | ORAL_TABLET | ORAL | Status: DC

## 2010-11-04 MED ORDER — GABAPENTIN 100 MG PO CAPS: 100.0000 mg | ORAL_CAPSULE | ORAL | Status: DC

## 2010-11-04 NOTE — Patient Instructions (Signed)

## 2010-11-04 NOTE — Progress Notes (Signed)
Subjective:    Patient ID: Tom Lee, male    DOB: 01/28/1966, 45 y.o.   MRN: [id number]  HPI #[id number] : Onset: > 2 months   Location: supraorbital area  Quality: throbbing Frequency: 2-3X/day Duration:hour  Precipitating factors: no; increased stress (financial, parenteral issues). He drinks 1/2 cola & 1 cup coffeee.  Prior treatment: Tylenol , NSAIDS, hydrocodone  w/o benefit. He has averaged 3 Advil for past 2+ months. Associated Symptoms Nausea/vomiting: nausea w/o vomiting  Photophobia/phonophobia: yes, sound increases pain  Tearing of eyes: yes, sometimes  Sinus pain/pressure: yes, above eyes  Family hx migraine: yes, mother  Prodrome:no definite symptom Aura:blurred vision Red Flags Fever: no  Neck pain/stiffness: no  Vision/speech/swallow/hearing difficulty: yes, tinnitus occasionally  Focal weakness/numbness: no  Altered mental status: yes, to some extent  Trauma: no  New type of headache: yes, PMH of migraines   #2 BACK  PAIN: Location: lower thoracic spine, bilaterally   Onset: 1 month   Severity: up to 8.5 Pain is described as: dull  Worse with: supine    Better with: mobilization  Pain radiates to: none   Impaired range of motion: can't rotate trunk History of repetitive motion: lifting 50-60 # X 50 over 8 hrs History of trauma:  no   Past history of similar problem:  no  Symptoms Numbness/tingling:  no  Weakness:  no Red Flags Fever:  no  Bowel/bladder dysfunction:  No Dysuria/ hematuria/pyuria: no        Review of Systems he is receiving dental care for caries. He denies ear pain or discharge. He's had no purulent nasal discharge.     Objective:   Physical Exam Gen.: Healthy, muscled  and well-nourished in appearance. Alert, appropriate and cooperative throughout exam. Head: Normocephalic without obvious abnormalities; head shaven Eyes: No corneal or conjunctival inflammation noted. Pupils equal round reactive to light and accommodation.  FOV normal. Extraocular motion intact. Vision grossly normal. Pterygia present Ears: External  ear exam reveals no significant lesions or deformities. Canals clear .TMs normal. Hearing is grossly normal bilaterally. Nose: External nasal exam reveals no deformity or inflammation. Nasal mucosa are pink and moist. No lesions or exudates noted.  Mouth: Oral mucosa and oropharynx reveal no lesions or exudates. Teeth  need repair. Neck: No deformities, masses, or tenderness noted. Range of motion normal. Lungs: Normal respiratory effort; chest expands symmetrically. Lungs are clear to auscultation without rales, wheezes, or increased work of breathing. Heart: Normal rate and rhythm. Normal S1 and S2. No gallop, click, or rub. No  murmur.                                                                                 Musculoskeletal/extremities: No deformity or scoliosis noted of  the thoracic or lumbar spine. Minimal asymmetry of thoracic muscles, tender to palpation. No clubbing, cyanosis, edema, or deformity noted.Tone & strength  normal.Joints normal. Nail health  good. Vascular: Carotid, radial artery, dorsalis pedis and  posterior tibial pulses are full and equal. No bruits present. Neurologic: Alert and oriented x3. Deep tendon reflexes symmetrical and normal. Gait (heel/toe), Romberg, finger to nose normal. Cranial nerve exam WNL EXCEPT facial asymmetry with smiling.  Skin: Intact without suspicious lesions or rashes. Lymph: No cervical, axillary  lymphadenopathy present. Psych: Mood and affect are normal. Normally interactive                                                                                         Assessment & Plan:  #1 bilateral frontal headaches in the context of a past history of  migraines. He is taking nonsteroidals on a regular basis. This suggests chronic daily headaches related to conversion of migraines from nonsteroidals. He does not ingest excess caffeine.  Neurologic exam was unremarkable except for mild asymmetry of smile. He denies Bell's palsy  #2 lower thoracic back pain in the context of repetitive heavy lifting.  #3 hematuria on urinalysis. Renal calculi must be  ruled out, the history does not suggest this as the pain does not radiate to the inguinal areas & is bilateral.  Plan: Urine will be cultured. He'll be asked to strain his urine for stones.  Muscle relaxants will be ordered for the back pain and referral made to physical therapy.  He is to keep a headache diary. He'll be asked to avoid the nonsteroidals. Gabapentin will be initiated every 8 hours as needed.

## 2010-11-06 LAB — URINE CULTURE: Organism ID, Bacteria: NO GROWTH

## 2010-11-07 NOTE — Telephone Encounter (Signed)
Last filled 09/27/10 and last office visits 11/04/10

## 2010-11-08 ENCOUNTER — Telehealth: Payer: Self-pay | Admitting: Internal Medicine

## 2010-11-08 NOTE — Telephone Encounter (Signed)
Ok 30, no RF Last OV w/ me 12-2009: needs ROV before next RF, please be sure pt knows

## 2010-11-08 NOTE — Telephone Encounter (Signed)
Spoke w/ pt informed of information pt doesn't want to take medication and wonder if something else could be prescribed.

## 2010-11-08 NOTE — Telephone Encounter (Signed)
Pt aware of information and referral also scheduled appt to recheck urine.

## 2010-11-08 NOTE — Telephone Encounter (Signed)
Rx phoned in. Notation made for prior OV needed before future refill authorizations. LMOM at pt's mobile # listed as to this information [patient's home #listed is [facility]]

## 2010-11-08 NOTE — Telephone Encounter (Signed)
Refer him to Neurology; he needs to recheck urine as he had microscopic blood @ appt

## 2010-11-08 NOTE — Telephone Encounter (Signed)
Pt called says that muscle relaxer is causing dry mouth and has concerns about whether gabapentin can cause cancer so he has been hesitant about taking medication would like to know if these two medication can cause particular side effects. Pls advise.

## 2010-11-08 NOTE — Telephone Encounter (Signed)
The muscle relaxants cause drowsiness; it does not  usually not cause dry mouth  The gabapentin has no cancer risk.

## 2010-11-09 ENCOUNTER — Encounter: Payer: Self-pay | Admitting: Internal Medicine

## 2010-11-11 ENCOUNTER — Other Ambulatory Visit: Payer: BC Managed Care – PPO

## 2010-11-17 ENCOUNTER — Other Ambulatory Visit: Payer: Self-pay | Admitting: Internal Medicine

## 2010-11-17 DIAGNOSIS — R319 Hematuria, unspecified: Secondary | ICD-10-CM

## 2010-11-18 ENCOUNTER — Other Ambulatory Visit: Payer: BC Managed Care – PPO

## 2010-11-18 NOTE — Progress Notes (Signed)
Labs only

## 2010-11-24 ENCOUNTER — Ambulatory Visit: Payer: BC Managed Care – PPO | Admitting: Neurology

## 2010-12-03 ENCOUNTER — Other Ambulatory Visit: Payer: Self-pay | Admitting: Internal Medicine

## 2010-12-16 ENCOUNTER — Other Ambulatory Visit: Payer: Self-pay | Admitting: Internal Medicine

## 2010-12-16 NOTE — Telephone Encounter (Signed)
Ok #30, no RF; OV if pain persist and requires more meds

## 2011-01-04 ENCOUNTER — Other Ambulatory Visit: Payer: Self-pay | Admitting: Internal Medicine

## 2011-01-05 NOTE — Telephone Encounter (Signed)
Hydrocodone-APAP request [last refill 12/16/2010 #30x0]

## 2011-01-06 ENCOUNTER — Other Ambulatory Visit: Payer: Self-pay | Admitting: Internal Medicine

## 2011-01-06 NOTE — Telephone Encounter (Signed)
Call a 1 week supply, arrange a  OV

## 2011-01-06 NOTE — Telephone Encounter (Signed)
Done

## 2011-01-06 NOTE — Telephone Encounter (Signed)
Done. OV scheduled 01/16/11.

## 2011-01-16 ENCOUNTER — Ambulatory Visit: Payer: BC Managed Care – PPO | Admitting: Internal Medicine

## 2011-01-16 DIAGNOSIS — Z0289 Encounter for other administrative examinations: Secondary | ICD-10-CM

## 2011-02-06 ENCOUNTER — Other Ambulatory Visit: Payer: Self-pay | Admitting: Internal Medicine

## 2011-02-06 NOTE — Telephone Encounter (Signed)
Denied, due for OV

## 2011-02-07 NOTE — Telephone Encounter (Signed)
Last ov 11/04/10 with Dr. Alwyn Ren last refill 01/04/11

## 2011-02-07 NOTE — Telephone Encounter (Signed)
Denied, needs OV 

## 2011-02-07 NOTE — Telephone Encounter (Signed)
Last OV 11/04/10. Last filled 01/06/11.

## 2011-02-10 ENCOUNTER — Other Ambulatory Visit: Payer: Self-pay | Admitting: Internal Medicine

## 2011-02-13 ENCOUNTER — Telehealth: Payer: Self-pay | Admitting: *Deleted

## 2011-02-13 NOTE — Telephone Encounter (Signed)
Advise patient, the last time I saw him for a BP check was September 2011. Please arrange a office visit

## 2011-02-13 NOTE — Telephone Encounter (Signed)
Triage Record Num: [id number] Operator: Tomasita Crumble Patient Name: Tom Lee Call Date & Time: 02/11/2011 4:33:59PM Patient Phone: [phone] PCP: Nolon Rod. Paz Patient Gender: Male PCP Fax : Patient DOB: 1965/10/22 Practice Name: [facility] Reason for Call: Caller: [name]; PCP: [name]; CB#: [phone]; Call Reason:Pt. states he ran out of BP med on 01/06/11; it is a 90 day refill. He did not receive call from pharmacy that it was due.Marland Kitchen He ran out of medication on 02/10/11. Losartan and Azor 5/20. Uses [facility] on [address] [phone] ; Afebrile; Wt: ; Guideline Used:Medication Questions ; Disp: Authorized enough to last until 11/19. Caller reports he will be out of town all next week. Provider on call Dr. Sharen Hones contacted and ordered to give 30 days refill on medication. Rx. called to pharmacy: Losartan 100 mg daily and Azor 5/20 daily doisp 30, no refills. Appt Scheduled?: No Protocol(s) Used: Medication Question Calls, No Triage (Adults) Recommended Outcome per Protocol: Provide Information or Advice Only Override Outcome if Used in Protocol: Call Provider Immediately RN Reason for Override Outcome: Other Reason for Outcome: Caller requesting a refill, no triage required and triager able to refill per unit policy

## 2011-02-14 NOTE — Telephone Encounter (Signed)
Left message for pt to call and schedule office visit

## 2011-03-16 ENCOUNTER — Other Ambulatory Visit: Payer: Self-pay | Admitting: Internal Medicine

## 2011-05-09 ENCOUNTER — Ambulatory Visit (INDEPENDENT_AMBULATORY_CARE_PROVIDER_SITE_OTHER): Payer: BC Managed Care – PPO | Admitting: Internal Medicine

## 2011-05-09 VITALS — BP 146/88 | HR 92 | Temp 98.4°F | Wt 201.0 lb

## 2011-05-09 DIAGNOSIS — I10 Essential (primary) hypertension: Secondary | ICD-10-CM

## 2011-05-09 DIAGNOSIS — J329 Chronic sinusitis, unspecified: Secondary | ICD-10-CM

## 2011-05-09 MED ORDER — AMOXICILLIN 500 MG PO CAPS: 1000.0000 mg | ORAL_CAPSULE | Freq: Two times a day (BID) | ORAL | Status: AC

## 2011-05-09 MED ORDER — HYDROCODONE-HOMATROPINE 5-1.5 MG/5ML PO SYRP: 5.0000 mL | ORAL_SOLUTION | Freq: Every evening | ORAL | Status: AC | PRN

## 2011-05-09 MED ORDER — FLUTICASONE PROPIONATE 50 MCG/ACT NA SUSP: 2.0000 | Freq: Every day | NASAL | Status: DC

## 2011-05-09 NOTE — Patient Instructions (Signed)
Rest, fluids , tylenol For cough, take Mucinex DM twice a day as needed  For night time cough, use hydrocodone For congestion use flonase, nasal spray Take the antibiotic as prescribed ----> Amoxicillin Call if no better in few days Call anytime if the symptoms are severe You are due for a physical, please schedule it at your earliest convenience

## 2011-05-09 NOTE — Assessment & Plan Note (Signed)
Reports good medication compliance, BP today slightly elevated. Strongly recommend to come back as soon as he's better for a physical.

## 2011-05-09 NOTE — Progress Notes (Signed)
  Subjective:    Patient ID: Tom Lee, male    DOB: 10/04/1965, 46 y.o.   MRN: [id number]  HPI Acute visit Symptoms started 10 days ago, runny nose, sinus congestion, frontal headache. At night, he has a lot of postnasal dripping which cause cough productive of yellow sputum.  Past Medical History: Asthma--dx as a child, no symptoms x years  HTN--dx 2011  Past Surgical History: Hernia Repair 10-2009 Broken Ankle (had surgery) -2008  Family History:  Hypertension--  F M MI-- no CHF-- M Strokes-- M x2 DM -- M colon ca--no prostate ca--no  Social History: Married, 5 boys Never Smoked Alcohol use-no Drug use-no Regular exercise-no   Review of Systems No fevers, occasional chill. No nausea, vomiting, diarrhea. No chest congestion per se, he has a history of asthma but has not been wheezing lately. Mild aches and pains    Objective:   Physical Exam  Constitutional: He is oriented to person, place, and time. He appears well-developed and well-nourished. No distress.  HENT:  Head: Normocephalic and atraumatic.       Face symmetric, tender to palpation on the left maxillary area. Ears with normal tympanic membranes. Nose slightly congested. Throat without redness.  Cardiovascular: Normal rate, regular rhythm and normal heart sounds.   No murmur heard. Pulmonary/Chest: Effort normal and breath sounds normal. No respiratory distress. He has no wheezes. He has no rales.  Musculoskeletal: He exhibits no edema.  Neurological: He is alert and oriented to person, place, and time.  Skin: He is not diaphoretic.       Assessment & Plan:  Sinusitis, instructions. We are prescribing hydrocodone, aware that it will make him sleepy.

## 2011-05-10 ENCOUNTER — Encounter: Payer: Self-pay | Admitting: Internal Medicine

## 2011-05-17 ENCOUNTER — Telehealth: Payer: Self-pay | Admitting: Internal Medicine

## 2011-05-17 NOTE — Telephone Encounter (Signed)
Left message to call office

## 2011-05-17 NOTE — Telephone Encounter (Signed)
Patient states he would like Dr. Drue Novel to call in something for his nerves. Patient does not want anything that will make him drowsy. [facility] [address]

## 2011-05-17 NOTE — Telephone Encounter (Signed)
Pt returned our call

## 2011-05-17 NOTE — Telephone Encounter (Signed)
I really need more information, I haven't discussed anxiety with him. Needs a OV please schedule.

## 2011-05-17 NOTE — Telephone Encounter (Signed)
Discuss with patient, already has appt scheduled for next week earlier Pt could come in.

## 2011-05-19 ENCOUNTER — Other Ambulatory Visit: Payer: Self-pay | Admitting: Internal Medicine

## 2011-05-19 NOTE — Telephone Encounter (Signed)
Sent in his losartan to pharmacy. & his pain med was sent in on 2.12.13 & he's already picked it up.

## 2011-05-19 NOTE — Telephone Encounter (Signed)
Patient called stating he has contacted his pharmacy and they do not have his Rx. He also states he requested his pain medication to be refilled in addition to his bp med, but I do not see anything in the chart for pain med refill. Patient states he is upset about having to call us for refills.

## 2011-05-22 ENCOUNTER — Other Ambulatory Visit: Payer: Self-pay | Admitting: *Deleted

## 2011-05-22 MED ORDER — HYDROCODONE-ACETAMINOPHEN 5-500 MG PO TABS: 1.0000 | ORAL_TABLET | Freq: Three times a day (TID) | ORAL | Status: AC | PRN

## 2011-05-22 NOTE — Telephone Encounter (Signed)
OK to refill

## 2011-05-22 NOTE — Telephone Encounter (Signed)
Pt indicated that he call on last week and med has still not been filled yet. Vicodin Last OV 05-09-11, last filled 01-04-12 #20

## 2011-05-22 NOTE — Telephone Encounter (Signed)
#  20, no RF

## 2011-05-22 NOTE — Telephone Encounter (Signed)
Refill done.  

## 2011-05-24 ENCOUNTER — Ambulatory Visit: Payer: BC Managed Care – PPO | Admitting: Internal Medicine

## 2011-06-07 ENCOUNTER — Other Ambulatory Visit: Payer: Self-pay | Admitting: Internal Medicine

## 2011-06-07 NOTE — Telephone Encounter (Signed)
Refill request Vicodin 5-500mg  #20 with zero refills. OK to refill?

## 2011-06-09 ENCOUNTER — Telehealth: Payer: Self-pay | Admitting: Internal Medicine

## 2011-06-09 NOTE — Telephone Encounter (Signed)
OK to refill

## 2011-06-09 NOTE — Telephone Encounter (Signed)
Opened in error

## 2011-06-09 NOTE — Telephone Encounter (Signed)
Patient called requesting his Vicodin. Pharmacy is still waiting to hear from Dr. Drue Novel.

## 2011-06-12 NOTE — Telephone Encounter (Signed)
Ok vicodin #20, no further RF w/o OV

## 2011-06-12 NOTE — Telephone Encounter (Signed)
Duplicate

## 2011-06-14 ENCOUNTER — Ambulatory Visit: Payer: BC Managed Care – PPO | Admitting: Internal Medicine

## 2011-06-22 ENCOUNTER — Other Ambulatory Visit: Payer: Self-pay | Admitting: Internal Medicine

## 2011-06-22 NOTE — Telephone Encounter (Signed)
Refill request Vicodin 5-500mg  #20 with zero refills. Last refilled on 3.13.13. Ok to refill?

## 2011-06-22 NOTE — Telephone Encounter (Signed)
Needs OV.  

## 2011-06-27 NOTE — Telephone Encounter (Signed)
Left detailed msg on pt's vmail.  

## 2011-06-30 ENCOUNTER — Other Ambulatory Visit: Payer: Self-pay

## 2011-06-30 NOTE — Telephone Encounter (Signed)
Pt informed of previous note of needing an OV for med refill

## 2011-07-03 ENCOUNTER — Other Ambulatory Visit: Payer: Self-pay | Admitting: *Deleted

## 2011-07-03 ENCOUNTER — Ambulatory Visit (INDEPENDENT_AMBULATORY_CARE_PROVIDER_SITE_OTHER): Payer: BC Managed Care – PPO | Admitting: Internal Medicine

## 2011-07-03 VITALS — BP 134/82 | HR 83 | Temp 98.4°F | Wt 201.0 lb

## 2011-07-03 DIAGNOSIS — F419 Anxiety disorder, unspecified: Secondary | ICD-10-CM | POA: Insufficient documentation

## 2011-07-03 DIAGNOSIS — F411 Generalized anxiety disorder: Secondary | ICD-10-CM

## 2011-07-03 DIAGNOSIS — Z0000 Encounter for general adult medical examination without abnormal findings: Secondary | ICD-10-CM

## 2011-07-03 DIAGNOSIS — R079 Chest pain, unspecified: Secondary | ICD-10-CM

## 2011-07-03 DIAGNOSIS — R519 Headache, unspecified: Secondary | ICD-10-CM | POA: Insufficient documentation

## 2011-07-03 DIAGNOSIS — R51 Headache: Secondary | ICD-10-CM | POA: Insufficient documentation

## 2011-07-03 MED ORDER — HYDROCODONE-ACETAMINOPHEN 5-500 MG PO TABS: 1.0000 | ORAL_TABLET | Freq: Three times a day (TID) | ORAL | Status: DC | PRN

## 2011-07-03 MED ORDER — ESCITALOPRAM OXALATE 10 MG PO TABS: 10.0000 mg | ORAL_TABLET | Freq: Every day | ORAL | Status: DC

## 2011-07-03 NOTE — Progress Notes (Signed)
  Subjective:    Patient ID: Tom Lee, male    DOB: September 04, 1965, 46 y.o.   MRN: [id number]  HPI Patient was  here for a CPX however complains of a headache and chest pain, we'll reschedule his CPX.  2 years history of headaches, about 2 or 3 episodes a month, headache last few hours, usually decreased with Tylenol and Advil. Located at the front and also right side. There is associated fatigue, nausea and anxiety. No sinusitis  type of symptoms  He also has chest pain, described as a cramp in the mid anterior  chest, this is going on for 7 months, happens every other day, it may last 4-6 hours, usually decreased when he "calms down". May  happen when he is resting or when he is active at work. Denies any association with difficulty breathing, nausea, vomiting or diarrhea.  He thinks both issues may be related with stress, wife dx w/ breast cancer ~ 2years ago, status post chemotherapy, radiation therapy and a mastectomy. His wife herself is very emotional, he complains of feeling anxious on and off, having problems with short temper. There is also problems w/ one of his children. Denies depression per se. He does have a difficult time sleeping.  Also has a skin lesion in the back, see physical exam  Past Medical History:  Asthma--dx as a child, no symptoms x years  HTN--dx 2011  Past Surgical History:  Hernia Repair 10-2009  Broken Ankle (had surgery) -2008  Family History:  Hypertension-- F M  MI-- no  CHF-- M  Strokes-- M x2  DM -- M  colon ca--no  prostate ca--no  Social History:  Married, 5 boys  Never Smoked  Alcohol use-no  Drug use-no  Regular exercise-no   Review of Systems See HPI    Objective:   Physical Exam   General:  alert, well-developed, and well-nourished.   Neck:  no masses and no thyromegaly.   Lungs:  normal respiratory effort, no intercostal retractions, no accessory muscle use, and normal breath sounds.   Heart:  normal rate, regular  rhythm, no murmur, and no gallop.  Chest wall is nontender to palpation Abdomen:  unable to lay down and the table due to recent surgery Extremities:  no lower extremity edema Neuro: External ocular movements intact, pupils equal and reactive. Motor strength, DTRs symmetric. Speech and gait normal. psych:  Oriented X3, memory intact for recent and remote, normally interactive, good eye contact, mildly anxious appearing, and not depressed appearing.  Skin-- has a soft, 1/2 cm polyp like lesion, skin colored located at the  the back (recommend observation)     Assessment & Plan:

## 2011-07-03 NOTE — Assessment & Plan Note (Addendum)
2 years history of a headache, given the situation he is  going through with his wife's cancer it may be tension but he also has some migraine features like nausea. Plan: CT of the head Rx anxiety Reassess on  return to the office

## 2011-07-03 NOTE — Assessment & Plan Note (Addendum)
The patient has obvious anxiety, likely related to his wife's health. I think is a good candidate for SSRI, we'll recommend Lexapro.

## 2011-07-03 NOTE — Patient Instructions (Signed)
Please get your x-ray at the other [facility]  office located at: [address] [place], across from [facility].  Please go to the basement, this is a walk-in facility, they are open from 8:30 to 5:30 PM. Phone number [phone]. --------------------------------- Reschedule your physical exam within a month

## 2011-07-03 NOTE — Assessment & Plan Note (Addendum)
46 year old gentleman with history of hypertension who presents with chest pain for several months. EKG normal sinus rhythm Heart disease cannot be rule out solely on clinical grounds, although it is likely that CP is related to anxiety. Plan:  Chest x-rays Stress test

## 2011-07-03 NOTE — Telephone Encounter (Signed)
Pt is requesting a refill for vicodin 5-500mg  #20 with zero refills. Last refilled on 3.13.13.

## 2011-07-03 NOTE — Telephone Encounter (Signed)
Refill done.  

## 2011-07-04 ENCOUNTER — Encounter: Payer: Self-pay | Admitting: Internal Medicine

## 2011-07-07 ENCOUNTER — Other Ambulatory Visit: Payer: BC Managed Care – PPO

## 2011-07-14 ENCOUNTER — Other Ambulatory Visit: Payer: BC Managed Care – PPO

## 2011-07-18 ENCOUNTER — Other Ambulatory Visit: Payer: Self-pay | Admitting: Internal Medicine

## 2011-07-18 ENCOUNTER — Ambulatory Visit (HOSPITAL_COMMUNITY): Payer: BC Managed Care – PPO

## 2011-07-18 ENCOUNTER — Other Ambulatory Visit: Payer: BC Managed Care – PPO

## 2011-07-18 NOTE — Telephone Encounter (Signed)
#  30 with zero refills. Last refilled on 4.8.13. OK to refill?

## 2011-07-19 ENCOUNTER — Ambulatory Visit (INDEPENDENT_AMBULATORY_CARE_PROVIDER_SITE_OTHER)
Admission: RE | Admit: 2011-07-19 | Discharge: 2011-07-19 | Disposition: A | Discharge status: Home / Self Care | Payer: BC Managed Care – PPO | Source: Ambulatory Visit | Attending: Internal Medicine | Admitting: Internal Medicine

## 2011-07-19 DIAGNOSIS — R51 Headache: Secondary | ICD-10-CM

## 2011-07-21 ENCOUNTER — Telehealth: Payer: Self-pay | Admitting: *Deleted

## 2011-07-21 MED ORDER — SERTRALINE HCL 25 MG PO TABS: ORAL_TABLET | ORAL | Status: DC

## 2011-07-21 NOTE — Telephone Encounter (Signed)
Discuss with patient, Rx sent. 

## 2011-07-21 NOTE — Telephone Encounter (Signed)
Patient is intolerant of Lexapro. Recommend Zoloft 25 mg one tablet daily for 2 weeks, then 2 tablets daily #60, no refills. please discussed with patient the fact that we are starting a very low dose of Zoloft, hopefully he will tolerate it better than lexapro.

## 2011-07-21 NOTE — Telephone Encounter (Signed)
Pt has called again requesting a refill. Please adivse.

## 2011-07-21 NOTE — Telephone Encounter (Signed)
Left message to call office

## 2011-07-21 NOTE — Telephone Encounter (Signed)
Pt states that (LEXAPRO) 10 MG tablet caused him to have diarrhea and made his head feel funny. Pt indicated that it was like a funny/swimming feeling in his head. Pt notes that he has since stop med and just want to let Dr Drue Novel know. .Please advise

## 2011-07-21 NOTE — Telephone Encounter (Signed)
2nd-request for refill on Hydrocodone/acetaminophen 5-500 Qty 30 Take 1-tablet by mouth every 6-hrs as needed for pain  Last filled 4.8.13 Last ov 4.9.13

## 2011-07-21 NOTE — Telephone Encounter (Signed)
Refill done.  

## 2011-07-26 ENCOUNTER — Ambulatory Visit (HOSPITAL_COMMUNITY): Payer: BC Managed Care – PPO | Attending: Internal Medicine | Admitting: Radiology

## 2011-07-26 VITALS — BP 122/88 | Ht 71.0 in | Wt 197.0 lb

## 2011-07-26 DIAGNOSIS — R0602 Shortness of breath: Secondary | ICD-10-CM | POA: Insufficient documentation

## 2011-07-26 DIAGNOSIS — J45909 Unspecified asthma, uncomplicated: Secondary | ICD-10-CM | POA: Insufficient documentation

## 2011-07-26 DIAGNOSIS — R0609 Other forms of dyspnea: Secondary | ICD-10-CM | POA: Insufficient documentation

## 2011-07-26 DIAGNOSIS — R079 Chest pain, unspecified: Secondary | ICD-10-CM

## 2011-07-26 DIAGNOSIS — R0989 Other specified symptoms and signs involving the circulatory and respiratory systems: Secondary | ICD-10-CM | POA: Insufficient documentation

## 2011-07-26 DIAGNOSIS — R5381 Other malaise: Secondary | ICD-10-CM | POA: Insufficient documentation

## 2011-07-26 DIAGNOSIS — R110 Nausea: Secondary | ICD-10-CM | POA: Insufficient documentation

## 2011-07-26 MED ORDER — TECHNETIUM TC 99M TETROFOSMIN IV KIT
11.0000 | PACK | Freq: Once | INTRAVENOUS | Status: AC | PRN
  Administered 2011-07-26: 11 via INTRAVENOUS

## 2011-07-26 MED ORDER — TECHNETIUM TC 99M TETROFOSMIN IV KIT
33.0000 | PACK | Freq: Once | INTRAVENOUS | Status: AC | PRN
  Administered 2011-07-26: 33 via INTRAVENOUS

## 2011-07-26 NOTE — Progress Notes (Signed)
[facility] SITE 3 NUCLEAR MED [address] [place] [address] [phone]  Cardiology Nuclear Med Study  Tom Lee is a 46 y.o. male     MRN : [id number]     DOB: 03-Oct-1965  Procedure Date: 07/26/2011  Nuclear Med Background Indication for Stress Test:  Evaluation for Ischemia History:  Asthma Cardiac Risk Factors: n/a  Symptoms:  Chest Pain, DOE, Fatigue, Sob, Nausea   Nuclear Pre-Procedure Caffeine/Decaff Intake:  None NPO After: 11:30pm   Lungs:  clear O2 Sat: 97% on room air. IV 0.9% NS with Angio Cath:  20g  IV Site: R Antecubital  IV Started by:  Bonnita Levan, RN  Chest Size (in):  44 Cup Size: n/a  Height: 5\' 11"  (1.803 m)  Weight:  197 lb (89.359 kg)  BMI:  Body mass index is 27.48 kg/(m^2). Tech Comments:  N/A    Nuclear Med Study 1 or 2 day study: 1 day  Stress Test Type:  Stress  Reading MD: Marca Ancona, MD  Order Authorizing Provider:  J.Paz  Resting Radionuclide: Technetium 57m Tetrofosmin  Resting Radionuclide Dose: 11.0 mCi   Stress Radionuclide:  Technetium 47m Tetrofosmin  Stress Radionuclide Dose: 33.0 mCi           Stress Protocol Rest HR: 65 Stress HR: 162  Rest BP: 122/88 Stress BP: 176/79  Exercise Time (min): 10:00 METS: 11.90   Predicted Max HR: 175 bpm % Max HR: 92.57 bpm Rate Pressure Product: 21308   Dose of Adenosine (mg):  n/a Dose of Lexiscan: n/a mg  Dose of Atropine (mg): n/a Dose of Dobutamine: n/a mcg/kg/min (at max HR)  Stress Test Technologist: Milana Na, EMT-P  Nuclear Technologist:  Domenic Polite, CNMT     Rest Procedure:  Myocardial perfusion imaging was performed at rest 45 minutes following the intravenous administration of Technetium 44m Tetrofosmin. Rest ECG: NSR with Early Repolarization  Stress Procedure:  The patient received IV Lexiscan 0.4 mg over 15-seconds.  Technetium 41m Tetrofosmin injected at 30-seconds.  There were no significant changes, stinging in the (L) chest (only  lasted a minute), and rare pacs with Lexiscan.  Quantitative spect images were obtained after a 45 minute delay. Stress ECG: No significant change from baseline ECG  QPS Raw Data Images:  Normal; no motion artifact; normal heart/lung ratio. Stress Images:  Normal homogeneous uptake in all areas of the myocardium. Rest Images:  Normal homogeneous uptake in all areas of the myocardium. Subtraction (SDS):  There is no evidence of scar or ischemia. Transient Ischemic Dilatation (Normal <1.22): 0.99 Lung/Heart Ratio (Normal <0.45):  0.27  Quantitative Gated Spect Images QGS EDV:  94 ml QGS ESV:  37 ml  Impression Exercise Capacity:  Good exercise capacity. BP Response:  Normal blood pressure response. Clinical Symptoms:  Chest "stinging" ECG Impression:  No significant ST segment change suggestive of ischemia. Comparison with Prior Nuclear Study: No images to compare  Overall Impression:  Normal stress nuclear study.  LV Ejection Fraction: 61%.  LV Wall Motion:  NL LV Function; NL Wall Motion  [employer]

## 2011-07-27 ENCOUNTER — Telehealth: Payer: Self-pay | Admitting: Internal Medicine

## 2011-07-27 NOTE — Telephone Encounter (Signed)
Discussed with pt

## 2011-07-27 NOTE — Telephone Encounter (Signed)
Advise patient, stress test negative. Patient to keep his appointment to see me a few days.

## 2011-08-04 ENCOUNTER — Other Ambulatory Visit: Payer: Self-pay | Admitting: Internal Medicine

## 2011-08-04 ENCOUNTER — Telehealth: Payer: Self-pay | Admitting: Internal Medicine

## 2011-08-04 ENCOUNTER — Ambulatory Visit (INDEPENDENT_AMBULATORY_CARE_PROVIDER_SITE_OTHER): Payer: BC Managed Care – PPO | Admitting: Internal Medicine

## 2011-08-04 DIAGNOSIS — R079 Chest pain, unspecified: Secondary | ICD-10-CM

## 2011-08-04 DIAGNOSIS — Z0000 Encounter for general adult medical examination without abnormal findings: Secondary | ICD-10-CM | POA: Insufficient documentation

## 2011-08-04 DIAGNOSIS — I10 Essential (primary) hypertension: Secondary | ICD-10-CM

## 2011-08-04 DIAGNOSIS — F419 Anxiety disorder, unspecified: Secondary | ICD-10-CM

## 2011-08-04 DIAGNOSIS — R51 Headache: Secondary | ICD-10-CM

## 2011-08-04 LAB — COMPREHENSIVE METABOLIC PANEL
ALT: 33 U/L (ref 0–53)
AST: 27 U/L (ref 0–37)
Calcium: 9.3 mg/dL (ref 8.4–10.5)
Chloride: 103 mEq/L (ref 96–112)
Creatinine, Ser: 0.9 mg/dL (ref 0.4–1.5)
Sodium: 141 mEq/L (ref 135–145)

## 2011-08-04 LAB — CBC WITH DIFFERENTIAL/PLATELET
Eosinophils Relative: 0.5 % (ref 0.0–5.0)
Monocytes Relative: 10.8 % (ref 3.0–12.0)
Neutrophils Relative %: 67.3 % (ref 43.0–77.0)
Platelets: 196 10*3/uL (ref 150.0–400.0)
WBC: 5.3 10*3/uL (ref 4.5–10.5)

## 2011-08-04 LAB — LDL CHOLESTEROL, DIRECT: Direct LDL: 52 mg/dL

## 2011-08-04 LAB — LIPID PANEL
HDL: 34.9 mg/dL — ABNORMAL LOW (ref >39.00)
Total CHOL/HDL Ratio: 4
Triglycerides: 228 mg/dL — ABNORMAL HIGH (ref 0.0–149.0)

## 2011-08-04 LAB — PSA: PSA: 0.67 ng/mL (ref 0.10–4.00)

## 2011-08-04 MED ORDER — CLONAZEPAM 0.5 MG PO TABS: 0.5000 mg | ORAL_TABLET | Freq: Two times a day (BID) | ORAL | Status: DC | PRN

## 2011-08-04 MED ORDER — METOPROLOL TARTRATE 50 MG PO TABS: 50.0000 mg | ORAL_TABLET | Freq: Two times a day (BID) | ORAL | Status: DC

## 2011-08-04 MED ORDER — HYDROCODONE-ACETAMINOPHEN 5-500 MG PO TABS: 1.0000 | ORAL_TABLET | Freq: Three times a day (TID) | ORAL | Status: DC | PRN

## 2011-08-04 NOTE — Progress Notes (Signed)
  Subjective:    Patient ID: Tom Lee, male    DOB: 23-Feb-1966, 46 y.o.   MRN: [id number]  HPI Complete physical exam, we are also following up on the previous visit.  Past Medical History:  Asthma--dx as a child, no symptoms x years  HTN--dx 2011  Past Surgical History:  Hernia Repair 10-2009  Broken Ankle (had surgery) -2008  Family History:  Hypertension-- F M  MI-- no  CHF-- M  Strokes-- M x2  DM -- M  colon ca--no  prostate ca--no  Social History:  Married, 5 boys  Never Smoked  Alcohol use-no  Drug use-no  Regular exercise-no   Review of Systems Chest pain, is now less noticeable, stress test was negative, chest x-ray was not done. Anxiety, he tried Lexapro, couldn't tolerate it. He was switched to Zoloft, he took it for approximately 10 days and discontinued it because he felt "tight in the nuchal area", during those days, the anxiety and depression felt no different. Denies suicidal ideas. He still has headaches, has noted that the HA is usually triggered by forgetting taking his  coffee. In general he takes one cup of coffee a day, one soda a day and sometimes a cup of tea. Occasional nausea, usually associated with headache. Denies abdominal pain blood in the stools. No difficulty urinating, no dysuria gross hematuria.    Objective:   Physical Exam  General -- alert, well-developed, and well-nourished.   Neck --no thyromegaly , normal carotid pulse Lungs -- normal respiratory effort, no intercostal retractions, no accessory muscle use, and normal breath sounds.   Heart-- normal rate, regular rhythm, no murmur, and no gallop.   Abdomen--soft, non-tender, no distention, no masses, no HSM, no guarding, and no rigidity.   Extremities-- no pretibial edema bilaterally Rectal-- No external abnormalities noted. Normal sphincter tone. No rectal masses or tenderness. No stools found Prostate:  Prostate gland firm and smooth, no enlargement, nodularity, tenderness,  mass, asymmetry or induration. Neurologic-- alert & oriented X3 and strength normal in all extremities. Psych-- Cognition and judgment appear intact. Alert and cooperative with normal attention span and concentration.  Today he is not anxious appearing and not depressed appearing.      Assessment & Plan:

## 2011-08-04 NOTE — Assessment & Plan Note (Signed)
Td 05 Labs including a PSA  Diet-exercise discussed

## 2011-08-04 NOTE — Assessment & Plan Note (Signed)
BP today 146/86. Plan: Add metoprolol, goal 120/80

## 2011-08-04 NOTE — Telephone Encounter (Signed)
Pt wants to know if he can have his vicodin refilled. Last filled on 4.23.13. #30 with zero refills.

## 2011-08-04 NOTE — Assessment & Plan Note (Signed)
Intolerant of Lexapro for due to diarrhea and "feeling funny" Intolerant to Zoloft, self discontinued a few days ago due to feeling tight in the nuchal area Plan: Discontinue SSRIs, low dose of clonazepam when necessary, strongly encourage counseling. Reassess in 2 months.

## 2011-08-04 NOTE — Assessment & Plan Note (Signed)
Stress test negative, symptoms improving. Plan: Treat anxiety

## 2011-08-04 NOTE — Assessment & Plan Note (Addendum)
Continue with headaches, CT negative. Migraine?  We are start a beta blocker for BP treatment. Plan: Neuro referral

## 2011-08-04 NOTE — Telephone Encounter (Signed)
Refill done.  

## 2011-08-04 NOTE — Telephone Encounter (Signed)
Patient called and stated he had his physical today and needs to speak with you regarding some medications he was prescribed today as well as some forms he needs filled out for his job Patient ph# [phone]

## 2011-08-04 NOTE — Telephone Encounter (Signed)
#  30, 0RF

## 2011-08-04 NOTE — Patient Instructions (Signed)
Strongly recommended to see a counselor. Check the  blood pressure 2 or 3 times a week, be sure it is between 110/60 and 140/85. If it is consistently higher or lower, let me know

## 2011-08-06 ENCOUNTER — Encounter: Payer: Self-pay | Admitting: Internal Medicine

## 2011-08-08 ENCOUNTER — Encounter: Payer: Self-pay | Admitting: *Deleted

## 2011-08-10 ENCOUNTER — Telehealth: Payer: Self-pay | Admitting: Internal Medicine

## 2011-08-10 NOTE — Telephone Encounter (Signed)
[facility] Triage Call Report Triage Record Num: [id number] Operator: Kelle Darting Patient Name: Anne Sebring Call Date & Time: 08/04/2011 6:02:44PM Patient Phone: [phone] PCP: Nolon Rod. Paz Patient Gender: Male PCP Fax : Patient DOB: 10-20-1965 Practice Name: [facility] Reason for Call: Caller: Ahmir/Patient; PCP: [name]; CB#: [phone]; Call regarding Rx not at Pharmacy; States he was seen in office today and one of his medicaitons were not at the Pharmacy, Rx for Vicodin; [facility] on [address] called and they verified that they did not received the approval for the Vicodin Refill; Per Epic chart, Rx was called in for Vicodin 5-500mg  per tab; 1 tab PRN pain; q 8 hr PRN pain; qty of 30 with No refills, Rx called to [facility]; Pt. made aware of same. Protocol(s) Used: Office Note Recommended Outcome per Protocol: Information Noted and Sent to Office Reason for Outcome: Caller information to office Care Advice: ~

## 2011-08-10 NOTE — Telephone Encounter (Signed)
noted 

## 2011-08-11 ENCOUNTER — Telehealth: Payer: Self-pay | Admitting: *Deleted

## 2011-08-11 MED ORDER — AMLODIPINE BESYLATE 5 MG PO TABS: 5.0000 mg | ORAL_TABLET | Freq: Every day | ORAL | Status: DC

## 2011-08-11 NOTE — Telephone Encounter (Signed)
D/c metoprolol Start amlodipine 5 mg 1 po qd #30, 6 RF Check BPs 2 or 3 times a week, goal between 110/60 and 140/85.  Call if s/e or not at goal

## 2011-08-11 NOTE — Telephone Encounter (Signed)
Discussed with pt, sent in rx.  

## 2011-08-11 NOTE — Telephone Encounter (Signed)
Pt called & stated that the metoprolol is giving him "severe cramping." Pt would like to know if there is another medication he can take instead. Please advise.

## 2011-08-18 ENCOUNTER — Telehealth: Payer: Self-pay | Admitting: Internal Medicine

## 2011-08-18 MED ORDER — HYDROCHLOROTHIAZIDE 12.5 MG PO TABS: 12.5000 mg | ORAL_TABLET | Freq: Every day | ORAL | Status: DC

## 2011-08-18 NOTE — Telephone Encounter (Signed)
Please advise 

## 2011-08-18 NOTE — Telephone Encounter (Signed)
Discontinue  amlodipine. Start hydrochlorothiazide 12.5 mg one by mouth daily #30 no refills. Office visit 2 weeks

## 2011-08-18 NOTE — Telephone Encounter (Signed)
Refill done. Left detailed msg on vmail.

## 2011-08-18 NOTE — Telephone Encounter (Signed)
Pt states he has switched blood pressure medications and is still having muscle cramps. Would like to know if there's anything else he can take.

## 2011-08-23 ENCOUNTER — Other Ambulatory Visit: Payer: Self-pay | Admitting: Internal Medicine

## 2011-08-23 NOTE — Telephone Encounter (Signed)
Ok to refill? Last filled on 5.10.13.

## 2011-08-24 ENCOUNTER — Other Ambulatory Visit: Payer: Self-pay | Admitting: Internal Medicine

## 2011-08-24 NOTE — Telephone Encounter (Signed)
Re-faxed rx.

## 2011-09-14 ENCOUNTER — Other Ambulatory Visit: Payer: Self-pay | Admitting: Internal Medicine

## 2011-09-14 NOTE — Telephone Encounter (Signed)
Denied, needs OV 

## 2011-09-15 ENCOUNTER — Other Ambulatory Visit: Payer: Self-pay | Admitting: Internal Medicine

## 2011-09-15 NOTE — Telephone Encounter (Signed)
Please call regarding denial of medication, patient states he will be in a meeting til 5:30pm today but you can leave him a voicemail message Ph# [phone]

## 2011-09-15 NOTE — Telephone Encounter (Signed)
Left detailed msg on pt's vmail.  

## 2011-09-15 NOTE — Telephone Encounter (Addendum)
Pt would like to know why Rx was denied and why he needs to have a OV because he was just in office 08-04-11.Please advise

## 2011-09-15 NOTE — Addendum Note (Signed)
Addended by: Candie Echevaria L on: 09/15/2011 12:46 PM   Modules accepted: Orders

## 2011-09-15 NOTE — Telephone Encounter (Signed)
Painkillers are not the right treatment for migraines. We'll have to discuss on return to the office. No further refills.

## 2011-09-15 NOTE — Telephone Encounter (Signed)
LMOVM to return call.

## 2011-09-15 NOTE — Telephone Encounter (Signed)
Pt called again regarding his rx. He has a follow up appointment scheduled for 09/25/11 to discuss his lab results and persistent migraines.

## 2011-09-15 NOTE — Telephone Encounter (Signed)
Left detailed msg on pt's vmail per request of pt.

## 2011-09-18 ENCOUNTER — Telehealth: Payer: Self-pay | Admitting: *Deleted

## 2011-09-18 NOTE — Telephone Encounter (Signed)
Pt left VM stating that he would like to know what other alteratives he can take for migraines since pain killer are not appropriate or what is the next step for treatment/prevention of migraines..Please advise

## 2011-09-19 MED ORDER — CYCLOBENZAPRINE HCL 10 MG PO TABS: ORAL_TABLET | ORAL | Status: DC

## 2011-09-19 NOTE — Telephone Encounter (Signed)
Headaches are migrainous versus tension headaches, he is under a lot of stress. He could try Flexeril 10 mg one by mouth each bedtime prn, #30 no refills. He has already been referred to neurology, needs to keep the appointment.

## 2011-09-19 NOTE — Telephone Encounter (Signed)
Discuss with patient Rx sent   

## 2011-09-19 NOTE — Telephone Encounter (Signed)
LMVOM for pt to return call.

## 2011-09-25 ENCOUNTER — Ambulatory Visit: Payer: BC Managed Care – PPO | Admitting: Internal Medicine

## 2011-09-29 ENCOUNTER — Telehealth: Payer: Self-pay | Admitting: Internal Medicine

## 2011-09-29 NOTE — Telephone Encounter (Signed)
Patient returned my call so I could give him contact info for [facility].  He then asked about medication he spoke with Lillia Abed about earlier.  I informed him that Dr. Drue Novel will not be prescribing him medication for headaches any longer, that he needs to see Neurology for this.  Patient not happy with that response, he feels that this is not right.  He still wants Lillia Abed to call him.

## 2011-09-29 NOTE — Telephone Encounter (Signed)
Please advise 

## 2011-10-02 NOTE — Telephone Encounter (Signed)
1. no further refills on Vicodin, narcotics are not a long-term option for headache treatment (on most patients), needs to see neuro 2. Needs office visit

## 2011-10-02 NOTE — Telephone Encounter (Signed)
Patient informed and understands about narcotic medications, reports that cyclobenzaprine is not helping H/As, just making him drowsy and request an alternative Rx or OTC suggestion he can take until 07.11.13 OV/SLS

## 2011-10-03 MED ORDER — ORPHENADRINE CITRATE ER 100 MG PO TB12: 100.0000 mg | ORAL_TABLET | Freq: Every evening | ORAL | Status: DC | PRN

## 2011-10-03 NOTE — Telephone Encounter (Signed)
Change to Norflex 100 mg 1 po qhs prn #30, no RF

## 2011-10-03 NOTE — Telephone Encounter (Signed)
LMOM with contact name & number and brief description of how medication works [employer] acts in CNS & is used to help relax certain muscles in your body and to relieve the pain and discomfort], as patient requested explanation when spoken to 07.08.13 as to have information as to what was being done until ROV & OV with Neurology; Rx to pharmacy/SLS

## 2011-10-05 ENCOUNTER — Ambulatory Visit: Payer: BC Managed Care – PPO | Admitting: Internal Medicine

## 2011-10-05 DIAGNOSIS — Z0289 Encounter for other administrative examinations: Secondary | ICD-10-CM

## 2011-10-10 ENCOUNTER — Ambulatory Visit: Payer: BC Managed Care – PPO | Admitting: Neurology

## 2011-10-25 ENCOUNTER — Telehealth: Payer: Self-pay | Admitting: Internal Medicine

## 2011-10-25 NOTE — Telephone Encounter (Signed)
Letter from [facility] neurology, they have attempted to contact the patient several times without success. Plan: No further prescriptions from this office.

## 2011-11-02 ENCOUNTER — Other Ambulatory Visit: Payer: Self-pay | Admitting: Internal Medicine

## 2011-11-02 NOTE — Telephone Encounter (Signed)
LMOVM for pt to return call 

## 2011-11-03 NOTE — Telephone Encounter (Addendum)
Discuss with patient who request a call personally from the covering provider for Dr Drue Novel. Pt c/b # [phone]

## 2011-11-03 NOTE — Telephone Encounter (Signed)
Left message to call office

## 2011-11-03 NOTE — Addendum Note (Signed)
Addended by: Candie Echevaria L on: 11/03/2011 10:27 AM   Modules accepted: Orders

## 2011-11-03 NOTE — Telephone Encounter (Signed)
He would need ov--- see phone note--- dr Drue Novel said no further rx from this office

## 2011-11-03 NOTE — Telephone Encounter (Addendum)
Per Dr Laury Axon is unable to review issues with Pt at this time due to currently seeing Pt in office, would like for Arlys John to give Pt a call to help resolve issue since Harriett Sine is out of office today. Discuss issue with Brain will give Pt a call in hopes of resolving issue. Pt contact info given to [place].

## 2011-11-03 NOTE — Telephone Encounter (Signed)
Pt return call advise him of comments in message per [place]. Pt states that he is currently out of town and will not get back in town until late tonight. Advise Pt that he may want to visit a local urgent care or if he would like we have a Saturday clinic that he can be seen at for his symptom. Pt decline requesting that the covering provider give him a call back. Pt indicated that he was told by Dr Drue Novel if he was not feeling well to give him a call and he would Rx med. Advise Pt that per protocol if he has not been seen within the past 30 days for issue no med would be Rx without OV but could advise on OTC/home items that can be used to treat Pt issue. Pt inform that I will forward to covering provider to see if they are willing to Rx med, Pt ok. .Please advise

## 2011-11-03 NOTE — Telephone Encounter (Signed)
Pt left msg on VM stating that he was having sinus infection symptoms again & would like a refill on this med. I returned pt's call & left msg on VM stating that he would need to be seen by one of the Dr.'s here at office for [place] to refill this med.

## 2012-01-04 IMAGING — CR DG CHEST 2V
2 series · 2 of 2 positions shown · non-contrast
Comparison: 04/26/2006

CLINICAL DATA: Chest pain

CHEST - 2 VIEW

[view not recorded (1 of 2)]
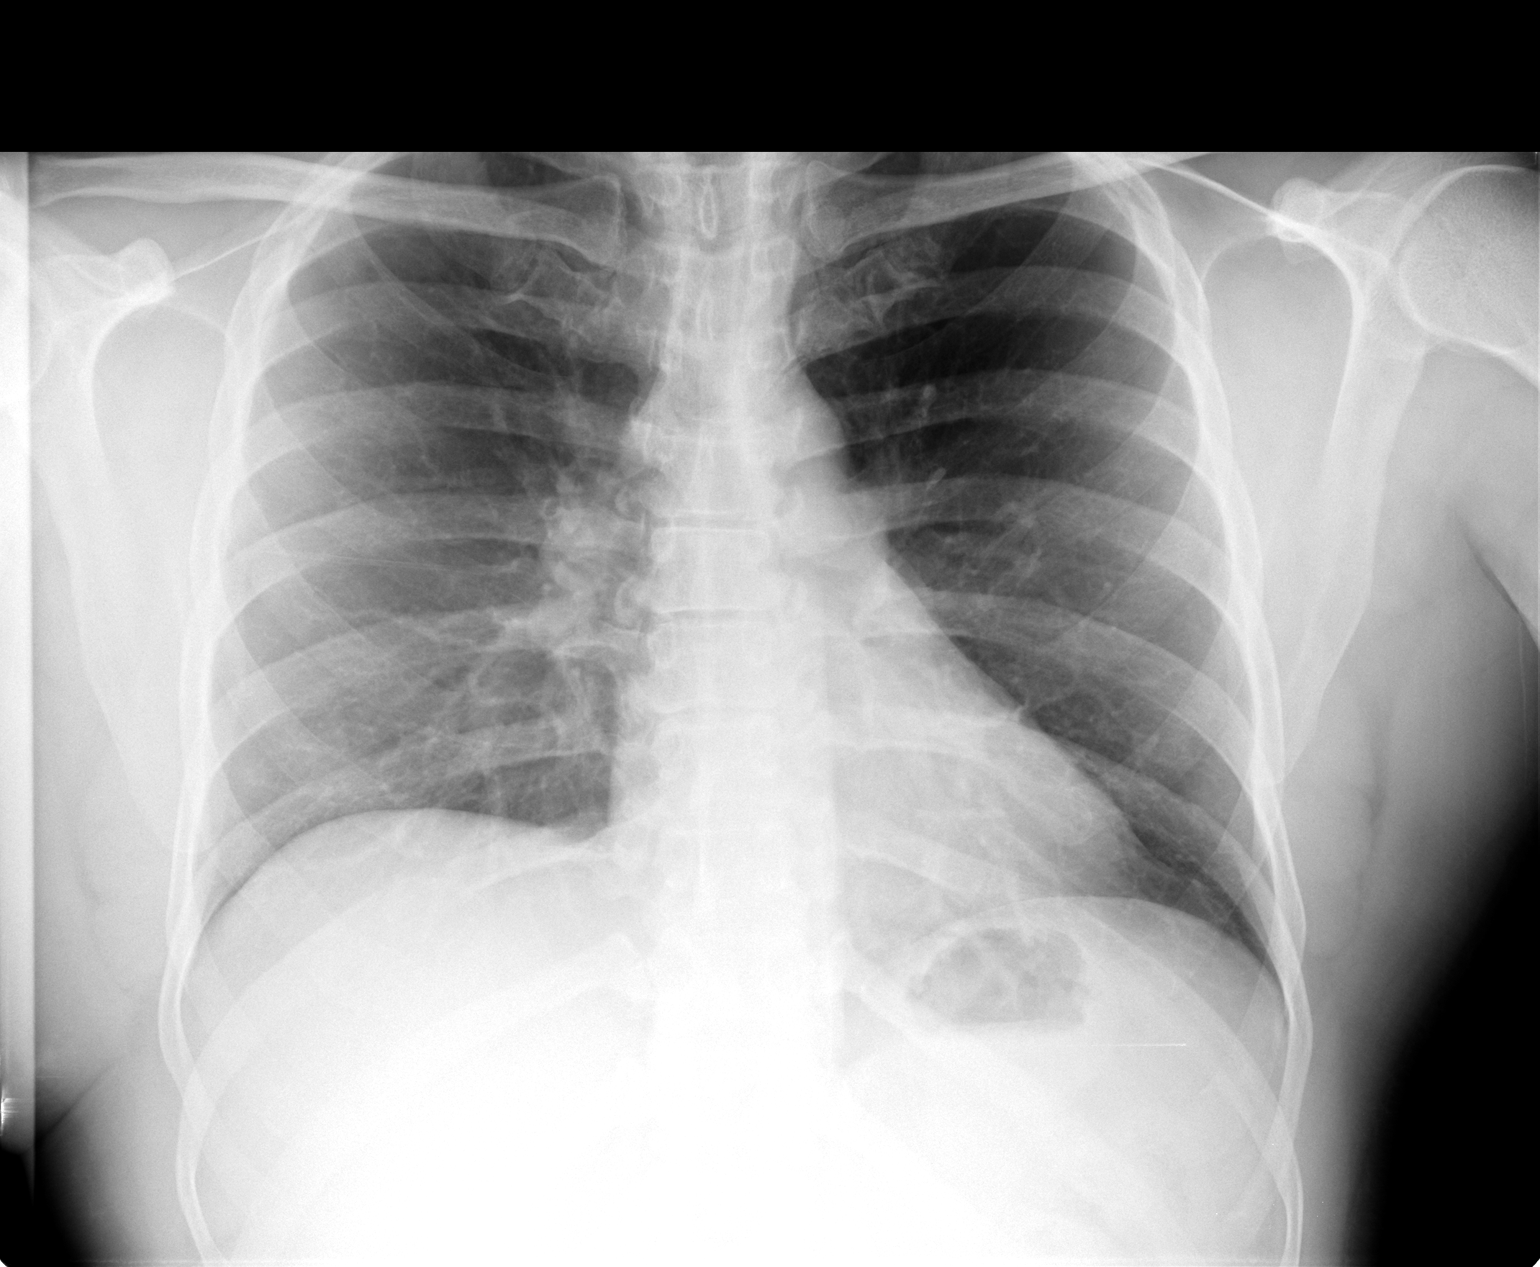

[view not recorded (2 of 2)]
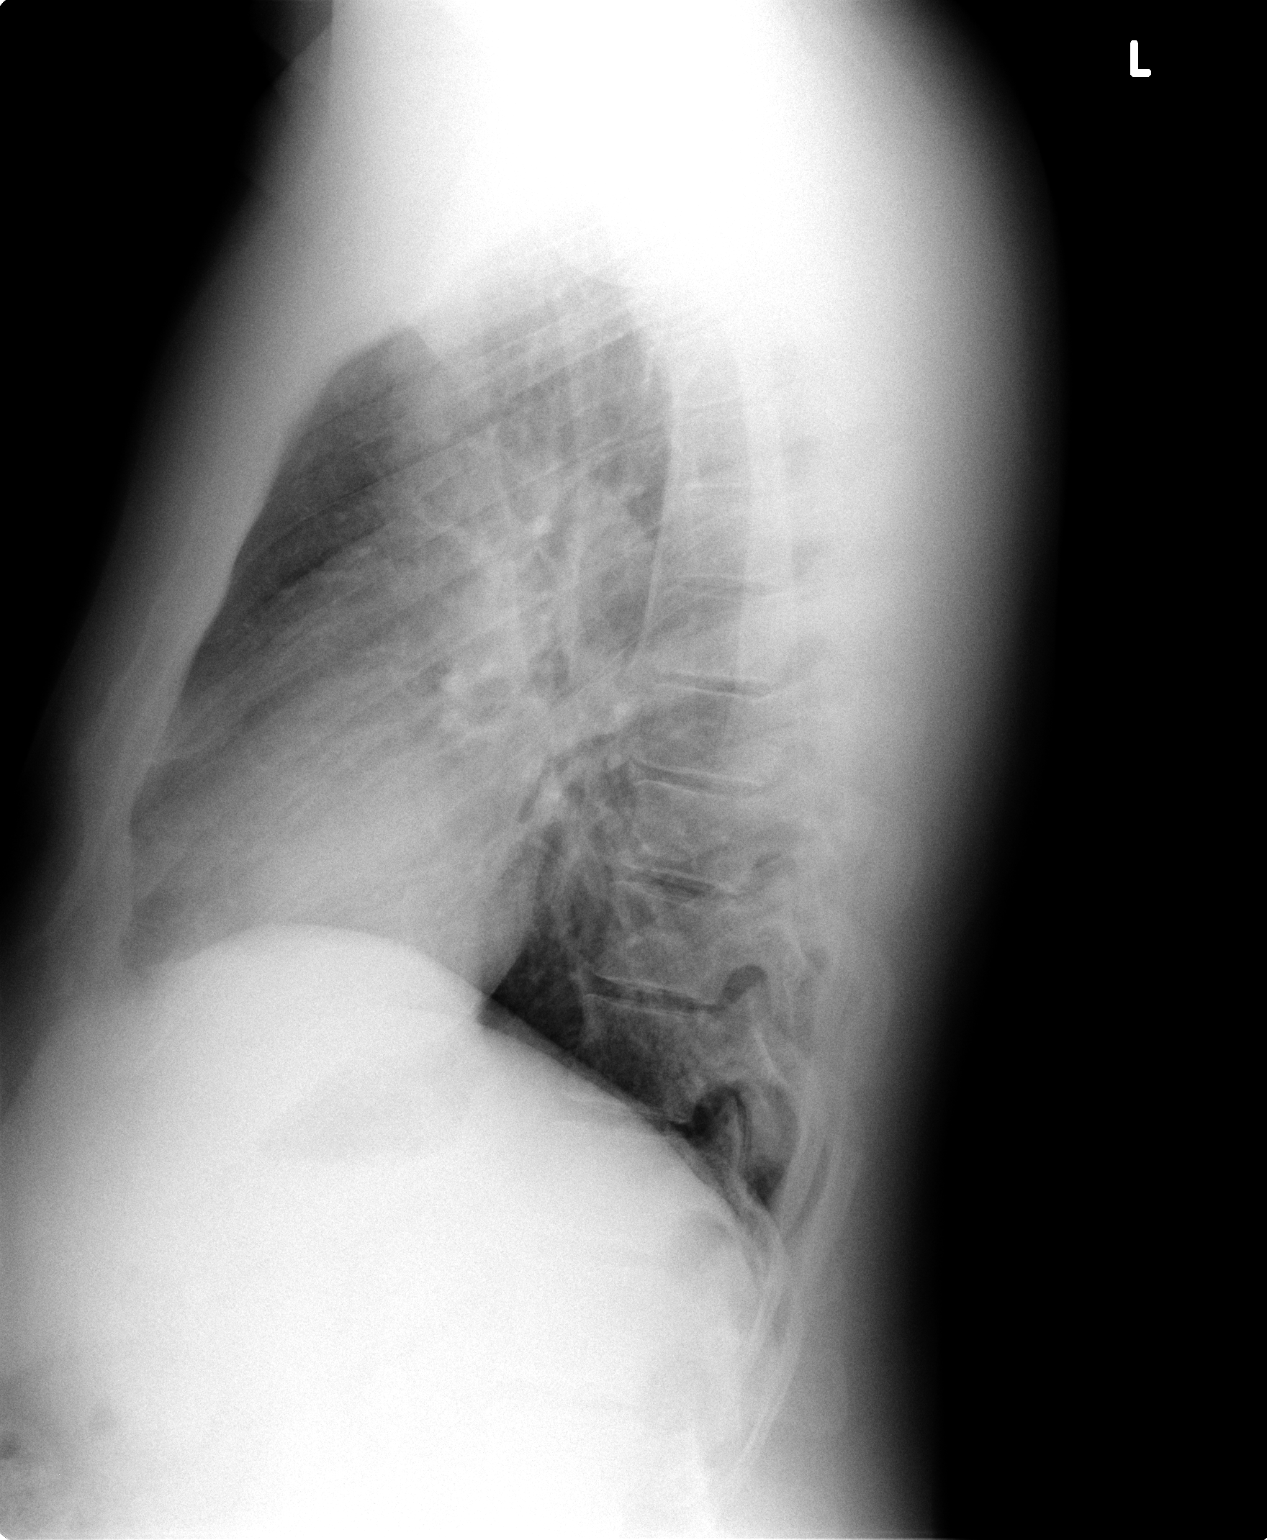

[2 of 2 positions shown; findings below may reference images not displayed]

FINDINGS: Heart size is normal.  Mediastinal shadows are normal.
The lungs are clear.  The vascularity is normal.  No effusions.  No
bony abnormalities.
IMPRESSION: Normal chest

## 2012-02-16 ENCOUNTER — Other Ambulatory Visit: Payer: Self-pay

## 2012-02-16 NOTE — Telephone Encounter (Signed)
Discussed with pt

## 2012-02-16 NOTE — Telephone Encounter (Signed)
Pt LMOVM stating has had cough for 2-3 days and doesn't seem to be clearing up. Pt requesting a cough syrup be sent to pharmacy. Last OV 08/04/06  PLz advise  MW

## 2012-02-16 NOTE — Telephone Encounter (Signed)
Recommend Mucinex DM OTC

## 2012-03-12 ENCOUNTER — Other Ambulatory Visit: Payer: Self-pay | Admitting: Internal Medicine

## 2012-03-12 NOTE — Telephone Encounter (Signed)
Denied, not on active medication list, also no further refills w/o OV

## 2012-03-12 NOTE — Telephone Encounter (Signed)
Denied.

## 2012-03-12 NOTE — Telephone Encounter (Signed)
Not on current med list. Ok to refill? 

## 2012-03-13 ENCOUNTER — Telehealth: Payer: Self-pay | Admitting: Internal Medicine

## 2012-03-13 NOTE — Telephone Encounter (Signed)
Pt called stated he had an URI x 27-month put him in same day tomorrow at 1130am to be seen to get ABX Pt stated he also needs his BP meds AZOR Pt stated he was advised by paz to alternate between Losartan & Azor and the Azor is the one that works for him. Cb# [phone]

## 2012-03-13 NOTE — Telephone Encounter (Signed)
Is supposed to be on losartan amlodipine instead of Azor. He is coming tomorrow, we'll see.

## 2012-03-13 NOTE — Telephone Encounter (Signed)
On 12.17.13 received refill request for azor. It was denied because the pt was no longer taking this med. OK to refill? Please advise.

## 2012-03-14 ENCOUNTER — Ambulatory Visit (INDEPENDENT_AMBULATORY_CARE_PROVIDER_SITE_OTHER): Payer: BC Managed Care – PPO | Admitting: Internal Medicine

## 2012-03-14 ENCOUNTER — Other Ambulatory Visit: Payer: Self-pay | Admitting: Internal Medicine

## 2012-03-14 ENCOUNTER — Encounter: Payer: Self-pay | Admitting: Internal Medicine

## 2012-03-14 VITALS — BP 150/86 | HR 116 | Temp 99.1°F | Wt 206.0 lb

## 2012-03-14 DIAGNOSIS — J329 Chronic sinusitis, unspecified: Secondary | ICD-10-CM

## 2012-03-14 DIAGNOSIS — I10 Essential (primary) hypertension: Secondary | ICD-10-CM

## 2012-03-14 MED ORDER — AMLODIPINE-OLMESARTAN 5-20 MG PO TABS: 1.0000 | ORAL_TABLET | Freq: Every day | ORAL | Status: DC

## 2012-03-14 MED ORDER — AMOXICILLIN-POT CLAVULANATE 875-125 MG PO TABS: 1.0000 | ORAL_TABLET | Freq: Two times a day (BID) | ORAL | Status: DC

## 2012-03-14 MED ORDER — FLUTICASONE PROPIONATE 50 MCG/ACT NA SUSP: 2.0000 | Freq: Every day | NASAL | Status: DC

## 2012-03-14 MED ORDER — ALBUTEROL SULFATE HFA 108 (90 BASE) MCG/ACT IN AERS: 2.0000 | INHALATION_SPRAY | Freq: Four times a day (QID) | RESPIRATORY_TRACT | Status: AC | PRN

## 2012-03-14 NOTE — Patient Instructions (Addendum)
Rest, fluids , tylenol For cough, take Mucinex DM twice a day as needed  For congestion use Flonase 2 sprays in each side of the nose daily. The wheezing or chest congestion, use albuterol as needed 4 times a day. Take the antibiotic as prescribed  (Augmentin) Call if no better in few days Call anytime if the symptoms are severe --- Continue Azor Check the  blood pressure 2 or 3 times a week, be sure it is between 110/60 and 140/85. If it is consistently higher or lower, let me know Next visit in 3 months

## 2012-03-14 NOTE — Assessment & Plan Note (Addendum)
Since the last time he was here, he reported cramps/intolerance to amlodipine and metoprolol. Eventually he took Azor which he tolerates well despite previous problems with amlodipine . Reports that with his medication, his BP is well-controlled, BP today elevated but hasn't take Azor in 2 days. Plan: Refill AZOR   BMP Followup in 3 months Advise patient, I'll be unable to take care of him if he does not come back routinely for checkups. See above, wife was diagnosed with breast cancer

## 2012-03-14 NOTE — Telephone Encounter (Signed)
Refill done.  

## 2012-03-14 NOTE — Telephone Encounter (Signed)
Patient is requesting a refill of allbuterol to be sent to [facility] on [address]

## 2012-03-14 NOTE — Telephone Encounter (Signed)
Handled at OV

## 2012-03-14 NOTE — Assessment & Plan Note (Signed)
Sinusitis and bronchitis, new problem. See instructions.

## 2012-03-14 NOTE — Progress Notes (Signed)
  Subjective:    Patient ID: Tom Lee, male    DOB: 17-Nov-1965, 46 y.o.   MRN: [id number]  HPI Acute visit Respiratory symptoms on and off for the last 4 weeks. Symptoms worse in the last 7 days: Cough, anterior chest pain with cough, sore throat, robitussin not helping. Hypertension, I review the approximately 8 phone calls since hisr last office visit, currently on Azor. See assessment and plan.  Past Medical History  Diagnosis Date  . Asthma     dx as a child, no symptoms x years  . Hypertension 2011    Past Surgical History  Procedure Date  . Hernia repair 10/2009  . Broken ankle 2008    had surgery     Review of Systems Denies chills, subjective fever last night. No nausea, vomiting, diarrhea. Some myalgias in the last few days. No sputum production, some wheezing. Very mild sinus pain, some sinus congestion.    Objective:   Physical Exam General -- alert, well-developed, and well-nourished.   HEENT -- TMs normal, throat w/o redness, face symmetric and slt tender to palpation @ maxilary area B, nose congested Lungs -- normal respiratory effort, no intercostal retractions, no accessory muscle use, and  few rhonchi, no wheezing today. Heart-- normal rate, regular rhythm, no murmur, and no gallop.    Psych-- Cognition and judgment appear intact. Alert and cooperative with normal attention span and concentration.  not anxious appearing and not depressed appearing.       Assessment & Plan:  Today , I spent more than 25  min with the patient, >50% of the time counseling, and reviewing the chart include the ~ 8 phone call since the last office visit. Patient reports that his wife was diagnosed with breast cancer, he has been unable to keep many of his  appointments.

## 2012-05-06 ENCOUNTER — Ambulatory Visit: Payer: BC Managed Care – PPO | Admitting: Internal Medicine

## 2012-05-06 DIAGNOSIS — Z0289 Encounter for other administrative examinations: Secondary | ICD-10-CM

## 2012-05-24 ENCOUNTER — Other Ambulatory Visit: Payer: Self-pay | Admitting: Internal Medicine

## 2012-05-27 NOTE — Telephone Encounter (Signed)
Not on pt's current med list. OK to refill? 

## 2012-05-27 NOTE — Telephone Encounter (Signed)
Not on losartan, taking azor

## 2012-05-27 NOTE — Telephone Encounter (Signed)
Refill denied 

## 2012-05-28 ENCOUNTER — Telehealth: Payer: Self-pay | Admitting: *Deleted

## 2012-05-28 NOTE — Telephone Encounter (Signed)
Received fax from pharmacy stating the pt's insurance does not cover azor 5-20mg . The recommended alternative is amlodipine besylate. Please advise.

## 2012-05-29 ENCOUNTER — Telehealth: Payer: Self-pay | Admitting: *Deleted

## 2012-05-29 MED ORDER — LOSARTAN POTASSIUM 100 MG PO TABS: 100.0000 mg | ORAL_TABLET | Freq: Every day | ORAL | Status: DC

## 2012-05-29 MED ORDER — AMLODIPINE BESYLATE 5 MG PO TABS: 5.0000 mg | ORAL_TABLET | Freq: Every day | ORAL | Status: DC

## 2012-05-29 NOTE — Telephone Encounter (Signed)
Notify patient of his insurance decision. If he likes to switch we can change to: Amlodipine 5 mg one by mouth daily  Losartan 100 mg one by mouth daily Call a six-month supply. Patient to let me know if the new combo is not working, BP more than 140/80 . BMP in 4 weeks.

## 2012-05-29 NOTE — Telephone Encounter (Signed)
Spoke with patient, made him aware of Dr. Leta Jungling suggested changes to medications. Patient also advised that BMP is needed in 4 weeks and to call office if B/P is greater than 140/80 after starting meds. Pt verbalized understanding and is in agreement to plan.

## 2012-05-29 NOTE — Telephone Encounter (Signed)
[name] discussed with pt, see previous phone note.

## 2012-06-27 ENCOUNTER — Telehealth: Payer: Self-pay | Admitting: *Deleted

## 2012-06-27 ENCOUNTER — Other Ambulatory Visit: Payer: Self-pay | Admitting: Internal Medicine

## 2012-06-27 NOTE — Telephone Encounter (Signed)
Not listed on pt's med list. OK to refill?

## 2012-06-27 NOTE — Telephone Encounter (Signed)
Pt left msg on triage stating that he would like a prescription sent in for his back pain. Pt states he has been taking tylenol but it's not cause nausea. Pt would also like a referral to a "back surgeon" so he can have surgery on his back. Please advise.

## 2012-06-28 ENCOUNTER — Telehealth: Payer: Self-pay | Admitting: *Deleted

## 2012-06-28 NOTE — Telephone Encounter (Signed)
Please advise 

## 2012-06-28 NOTE — Telephone Encounter (Signed)
*  see previous phone note*

## 2012-06-28 NOTE — Telephone Encounter (Signed)
Patient called back and left another message on triage very angry that "he is not getting what he needs for his pain." Patient stated that "he is just going to request his records be sent to another MD that will help him" and hung up.

## 2012-06-28 NOTE — Telephone Encounter (Signed)
D/c muscle relaxant. Needs OV or see chiropractor

## 2012-06-28 NOTE — Telephone Encounter (Addendum)
just refill a muscle relaxant. As far as the back pain, I have not assessed that problem in long time, recommend to see a chiropractor first or come here for evaluation.

## 2012-06-28 NOTE — Telephone Encounter (Signed)
rx sent to pharmacy.  Left detailed msg on pt's vmail.  

## 2012-06-28 NOTE — Telephone Encounter (Signed)
1. Okay #30, no refills 2. Please see previous phone notes, he is due for a BMP DX hypertension. Please be sure that is scheduled.

## 2012-06-28 NOTE — Telephone Encounter (Signed)
Patient left message on triage line requesting change in pain medication ordered. Patient states the current pain medication makes him very sleepy, dizzy and nauseous. Would like to have something else prescribed. Currently taking Norflex 100mg  po q hs prn. Can we change pain med to something else?

## 2012-06-28 NOTE — Telephone Encounter (Signed)
Left detailed msg on pt's vmail.  

## 2012-06-28 NOTE — Telephone Encounter (Signed)
Patient states he does not want the muscle relaxer because it makes him too drowsy. He is requesting something else.

## 2012-06-28 NOTE — Telephone Encounter (Signed)
Pt made aware. Refill done.

## 2012-07-01 NOTE — Telephone Encounter (Signed)
Noted  

## 2012-09-08 ENCOUNTER — Other Ambulatory Visit: Payer: Self-pay | Admitting: Internal Medicine

## 2012-09-09 NOTE — Telephone Encounter (Signed)
Pt does still have refills left on file. Refill request sent in error.

## 2012-10-28 ENCOUNTER — Ambulatory Visit (INDEPENDENT_AMBULATORY_CARE_PROVIDER_SITE_OTHER): Payer: No Typology Code available for payment source | Admitting: Internal Medicine

## 2012-10-28 ENCOUNTER — Encounter: Payer: Self-pay | Admitting: Internal Medicine

## 2012-10-28 ENCOUNTER — Ambulatory Visit: Payer: BC Managed Care – PPO | Admitting: Internal Medicine

## 2012-10-28 ENCOUNTER — Telehealth: Payer: Self-pay | Admitting: Internal Medicine

## 2012-10-28 VITALS — BP 140/90 | HR 93 | Temp 98.7°F | Wt 197.0 lb

## 2012-10-28 DIAGNOSIS — L989 Disorder of the skin and subcutaneous tissue, unspecified: Secondary | ICD-10-CM

## 2012-10-28 DIAGNOSIS — R202 Paresthesia of skin: Secondary | ICD-10-CM | POA: Insufficient documentation

## 2012-10-28 DIAGNOSIS — R209 Unspecified disturbances of skin sensation: Secondary | ICD-10-CM

## 2012-10-28 DIAGNOSIS — I10 Essential (primary) hypertension: Secondary | ICD-10-CM

## 2012-10-28 DIAGNOSIS — M549 Dorsalgia, unspecified: Secondary | ICD-10-CM

## 2012-10-28 LAB — POCT URINALYSIS DIPSTICK
Bilirubin, UA: NEGATIVE
Glucose, UA: NEGATIVE
Leukocytes, UA: NEGATIVE
Nitrite, UA: NEGATIVE
Urobilinogen, UA: 0.2
pH, UA: 6.5

## 2012-10-28 LAB — URINALYSIS, ROUTINE W REFLEX MICROSCOPIC
Bilirubin Urine: NEGATIVE
Ketones, ur: NEGATIVE
Leukocytes, UA: NEGATIVE
Specific Gravity, Urine: <=1.005 (ref 1.000–1.030)
Total Protein, Urine: NEGATIVE
pH: 6 (ref 5.0–8.0)

## 2012-10-28 LAB — BASIC METABOLIC PANEL
Calcium: 9.4 mg/dL (ref 8.4–10.5)
GFR: 105.46 mL/min (ref >60.00)
Sodium: 140 mEq/L (ref 135–145)

## 2012-10-28 MED ORDER — CYCLOBENZAPRINE HCL 10 MG PO TABS: 10.0000 mg | ORAL_TABLET | Freq: Two times a day (BID) | ORAL | Status: DC | PRN

## 2012-10-28 MED ORDER — AMLODIPINE BESYLATE 5 MG PO TABS: 5.0000 mg | ORAL_TABLET | Freq: Every day | ORAL | Status: DC

## 2012-10-28 NOTE — Progress Notes (Signed)
  Subjective:    Patient ID: Tom Lee, male    DOB: [birth year], 47 y.o.   MRN: [id number]  HPI Acute visit, has multiple concerns -- 4 weeks history of on and off pain in the mid back, it actually, occasionally radiates up but not down, it increases sometimes by  twisting his torso in certain ways. --L hearing was muffled last week, then R got the same way, now he is doing well --Paresthesias: 2 weeks ago felt numbness and tingling in his whole left arm and some in the left leg, symptoms resolve, last week had similar symptoms but on the right side. Better now. -- Skin lesion in the back has grown, also 6 months ago noted a new lesion by the right hip, not tender. -- Since he is here, I notice his BP to be elevated, see assessment and plan.  Past Medical History  Diagnosis Date  . Asthma     dx as a child, no symptoms x years  . Hypertension 2011   Past Surgical History  Procedure Laterality Date  . Hernia repair  10/2009  . Broken ankle  2008    had surgery   History   Social History  . Marital Status: Married    Spouse Name: N/A    Number of Children: 5  . Years of Education: N/A   Occupational History  . Not on file.   Social History Main Topics  . Smoking status: Former Games developer  . Smokeless tobacco: Not on file     Comment: 11-12 years as of 2012  . Alcohol Use: No  . Drug Use: No  . Sexually Active: Not on file   Other Topics Concern  . Not on file   Social History Narrative  . No narrative on file      Review of Systems No fever or chills. No dysuria gross nocturia. No abdominal or  flank pain per se. Denies slurred speech, diplopia or facial numbness. No recent URI, ear pain or ear discharge.      Objective:   Physical Exam  Skin:      BP 140/90  Pulse 93  Temp(Src) 98.7 F (37.1 C) (Oral)  Wt 197 lb (89.359 kg)  BMI 27.49 kg/m2  SpO2 99%  General -- alert, well-developed, NAD .   Neck -- normal carotid pulse  HEENT -- EOMI, Pupils  equal and reactive. Not pale.  Lungs -- normal respiratory effort, no intercostal retractions, no accessory muscle use, and normal breath sounds.   Heart-- normal rate, regular rhythm, no murmur, and no gallop.   Abdomen--soft, non-tender, no distention, no CVA tenderness Extremities-- no pretibial edema bilaterally Neurologic-- alert & oriented X3; Speech, gait, motor intact. DTRs symmetric. Psych-- Cognition and judgment appear intact. Alert and cooperative with normal attention span and concentration.  not anxious appearing and not depressed appearing.         Assessment & Plan:  Ear complains: resolved

## 2012-10-28 NOTE — Assessment & Plan Note (Addendum)
See previous entry, he was doing well on Azor but d/t insurance constraints is now on losartan. Also, still has cramps on and off even though he's not taking amlodipine or metoprolol, thus not likely intolerant to such meds. Ambulatory BP 120-140/80--> goal 120/80  Plan:Add amlodipine 5 mg, BMP

## 2012-10-28 NOTE — Assessment & Plan Note (Addendum)
Reports upper and lower extremity paresthesias, sometimes in the right sometimes and left for the last few weeks. Neurological exam nonfocal today. Patient is concerned about strokes, recommend neurology eval, will arrange

## 2012-10-28 NOTE — Patient Instructions (Signed)
Tylenol  500 mg OTC 2 tabs a day every 8 hours as needed for pain Flexeril twice a day as needed for pain (this is a muscle relaxant, will cause drowsiness) Call if the pain is not improving In 2 or 3 weeks --- Check the  blood pressure 2 or 3 times a week, be sure it is between 110/60 and 140/85. If it is consistently higher or lower, let me know --- Followup in 2 months

## 2012-10-28 NOTE — Telephone Encounter (Signed)
Spoke with pt after he returned home form his visit today. Patient states he "felt rushed through appt" because he was not able to discuss all the  concerns he had. I advised pt that his appt slot was for an acute issue, not one to cover multiple complaints. Patient advised that if he needed to discuss other issues then that would require more appts with possibly a longer time slot. Patient stated he "was okay with that" Patient went on to state that "I am going through a lot at home right now and did not feel I got any sympathy from my doctor." Patient stated that "when I was telling him about my back then the problems I had with my ears and arm, he just threw his hands up and told me this was an acute visit and he couldn't cover all that today." I again reminded the pt that an acute visit is indeed a single problem focused visit but that his other concerns could be addessed during another extended time visit. Patient ended conversation with "I have always liked Dr. Drue Novel but I feel I was too rushed today and did not feel he took the time to listen to me." I apologized to the patient that he felt rushed but also reminded him that he did have an acute appt slot and that Dr. Drue Novel did address his most important problem.

## 2012-10-28 NOTE — Telephone Encounter (Signed)
Patient was seen acutely for back pain. In addition he had paresthesias and I HAD TO  address his BP which was slt elevated. At the end of the visit, he insisted on tell me about the skin tag the subcutaneous nodule even though I told him we're trying to address too many problems in a single visit. He did not mention at any point during the office visit his wife. Evidently, I am not meeting the patient's expectations, the patient told us is leaving the practice, I think that is a good decision, this is not a therapeutic relationship. I'm also going to dismiss his from this office, I will order all the referrals as I promised him I would. [name] please start the dismissal process.

## 2012-10-28 NOTE — Assessment & Plan Note (Addendum)
Thoracic back pain, seems mechanical in nature, patient is concerned about a " kidney problem" Plan:  Conservative treatment with Tylenol (Unable to take Flexeril)  Udip: + blood, will send to the lab, for a UCX and UA, if UA confirms blood, will need further eval

## 2012-10-28 NOTE — Assessment & Plan Note (Addendum)
1. Large skin tag, getting bigger 2. Six-month history of subcutaneous nodules , right hip Plan:  Refer to Gen. Surgery, quite concern about the  subcutaneous nodule, also consider removal of the skin tag

## 2012-10-28 NOTE — Telephone Encounter (Signed)
Patient is calling wanting to speak with someone about his visit today. States that he does not feel as if he got anything accomplished and is disappointed because he feels like he was not listened to. He would like to go over his plan of care. Please advise.

## 2012-10-28 NOTE — Telephone Encounter (Signed)
Spoke with patient. Pt. Concerned about flexeril and tylenol causing excessive drowsiness. Pt. Also stated several service recovery issues that were transferred to Vidal Schwalbe RN. Dr. Drue Novel. Made aware of concerns. No new orders at this time.

## 2012-10-29 ENCOUNTER — Encounter: Payer: Self-pay | Admitting: *Deleted

## 2012-10-29 NOTE — Telephone Encounter (Signed)
Dismissal letter printed

## 2012-10-31 ENCOUNTER — Telehealth: Payer: Self-pay | Admitting: Internal Medicine

## 2012-10-31 ENCOUNTER — Telehealth: Payer: Self-pay | Admitting: *Deleted

## 2012-10-31 ENCOUNTER — Encounter: Payer: Self-pay | Admitting: *Deleted

## 2012-10-31 NOTE — Telephone Encounter (Signed)
Patient is in the process of being dismissed from from the practice. Dismissal letter states we sill see pt for emergent needs, pain meds are not considered an emergent need.

## 2012-10-31 NOTE — Telephone Encounter (Signed)
Received call on nurse line from patient. He states he wanted to follow up on his original concern from 10/28/12. He did not wish to take the prescribed flexeril from Dr. Drue Novel, nor did he want to overuse the Tylenol. Was wanting advise on back pain management. Noted in chart pt. Has been dismissed from practice as of 10/29/12. Dr. Drue Novel was made aware of pt. Concerns on 10/28/12 (see telephone note). No orders were received at that time to change pt. Medication regimen. Please advise on what course of action needs to be taken thank you.

## 2012-10-31 NOTE — Telephone Encounter (Addendum)
Patient dismissed from [facility] by [name] MD , effective October 29, 2012. Dismissal letter sent out by certified / registered mail. DAJ  Received signed domestic return receipt verifying delivery of certified letter on November 04, 2012. Article number [id number] DAJ

## 2012-11-12 ENCOUNTER — Ambulatory Visit (INDEPENDENT_AMBULATORY_CARE_PROVIDER_SITE_OTHER): Payer: No Typology Code available for payment source | Admitting: General Surgery

## 2012-11-21 ENCOUNTER — Encounter (INDEPENDENT_AMBULATORY_CARE_PROVIDER_SITE_OTHER): Payer: Self-pay | Admitting: General Surgery

## 2012-11-21 ENCOUNTER — Ambulatory Visit (INDEPENDENT_AMBULATORY_CARE_PROVIDER_SITE_OTHER): Payer: No Typology Code available for payment source | Admitting: General Surgery

## 2012-11-21 VITALS — BP 126/70 | HR 72 | Temp 98.0°F | Resp 16 | Ht 71.0 in | Wt 198.2 lb

## 2012-11-21 DIAGNOSIS — L989 Disorder of the skin and subcutaneous tissue, unspecified: Secondary | ICD-10-CM

## 2012-11-21 NOTE — Progress Notes (Signed)
Patient ID: Tom Lee, male   DOB: April 24, 1965, 47 y.o.   MRN: [id number]  Chief Complaint  Patient presents with  . New Evaluation    eval skin lesion on back    HPI Tom Lee is a 47 y.o. male.  We are asked to see the patient in consultation by Dr. Drue Novel to evaluate him for a mass on his back. The patient is a 47 year old black male who has had a mass on his back for the last one to 2 years. During that time it has gradually gotten larger. He denies any pain. It does stick out so he does have a tendency to traumatize it. There has been no bleeding or itching. He also notes a small mass in the left axilla as well. HPI  Past Medical History  Diagnosis Date  . Asthma     dx as a child, no symptoms x years  . Hypertension 2011  . Arthritis     Past Surgical History  Procedure Laterality Date  . Hernia repair  10/2009  . Broken ankle  2008    had surgery    Family History  Problem Relation Age of Onset  . Hypertension Mother     CHF  . Stroke Mother     x 2  . Diabetes Mother   . Hypertension Father   . Heart attack Neg Hx   . Colon cancer Neg Hx   . Prostate cancer Neg Hx     Social History History  Substance Use Topics  . Smoking status: Former Games developer  . Smokeless tobacco: Never Used     Comment: 11-12 years as of 2012  . Alcohol Use: No    Allergies  Allergen Reactions  . Diphenhydramine Hcl     REACTION: increased swelling and hives  . Escitalopram Oxalate     Current Outpatient Prescriptions  Medication Sig Dispense Refill  . Ibuprofen (ADVIL) 200 MG CAPS Take by mouth as needed.      Marland Kitchen losartan (COZAAR) 100 MG tablet Take 1 tablet (100 mg total) by mouth daily.  90 tablet  3  . albuterol (VENTOLIN HFA) 108 (90 BASE) MCG/ACT inhaler Inhale 2 puffs into the lungs every 6 (six) hours as needed.  18 g  1   No current facility-administered medications for this visit.    Review of Systems Review of Systems  Constitutional: Negative.   HENT:  Negative.   Eyes: Negative.   Respiratory: Negative.   Cardiovascular: Negative.   Gastrointestinal: Negative.   Endocrine: Negative.   Genitourinary: Negative.   Musculoskeletal: Negative.   Skin: Negative.   Allergic/Immunologic: Negative.   Neurological: Negative.   Hematological: Negative.   Psychiatric/Behavioral: Negative.     Blood pressure 126/70, pulse 72, temperature 98 F (36.7 C), temperature source Oral, resp. rate 16, height 5\' 11"  (1.803 m), weight 198 lb 3.2 oz (89.903 kg).  Physical Exam Physical Exam  Constitutional: He is oriented to person, place, and time. He appears well-developed and well-nourished.  HENT:  Head: Normocephalic and atraumatic.  Eyes: Conjunctivae and EOM are normal. Pupils are equal, round, and reactive to light.  Neck: Normal range of motion. Neck supple.  Cardiovascular: Normal rate, regular rhythm and normal heart sounds.   Pulmonary/Chest: Effort normal and breath sounds normal.  On his right low back he has a polypoid skin lesion with a very narrow base. He also has a small skin tag in the left axilla.  Abdominal: Soft. Bowel sounds are  normal.  Musculoskeletal: Normal range of motion.  Neurological: He is alert and oriented to person, place, and time.  Skin: Skin is warm and dry.  Psychiatric: He has a normal mood and affect. His behavior is normal.    Data Reviewed As above  Assessment    The patient has a moderate-sized polypoid skin lesion on his right low back as well as a skin tag in the left axilla. He would like both of these removed. I think this is a reasonable thing to do especially since the one on his back is getting larger. I've discussed with him in detail the risks and benefits of the operation to remove each of these areas as well as some of the technical aspects and he understands and wishes to proceed     Plan    Plan to excise the skin lesion on his back and left axilla        TOTH III,PAUL S 11/21/2012,  3:49 PM

## 2013-01-02 ENCOUNTER — Telehealth (INDEPENDENT_AMBULATORY_CARE_PROVIDER_SITE_OTHER): Payer: Self-pay

## 2013-01-02 NOTE — Telephone Encounter (Signed)
Called pt back and he wanted to know about how long he would be out of work. Advised he could be out around 2 weeks or so  depending on how he heals. He would like to go ahead and schedule surgery. Advised Dr Carolynne Edouard will be back next week to do orders. He wants to make sure insurance will pay. Told him we will contact insurance and schedulers will give him financial responsibilities when scheduling.

## 2013-01-02 NOTE — Telephone Encounter (Signed)
Message copied by Brennan Bailey on Thu Jan 02, 2013  1:52 PM ------      Message from: Rise Paganini      Created: Tue Dec 31, 2012  1:03 PM      Regarding: Tom Lee      Contact: [phone]       Patient has some questions about the procedure and would like to schedule sx after you answer some questions. Please call to discuss. Thank you. ------

## 2013-01-13 ENCOUNTER — Telehealth (INDEPENDENT_AMBULATORY_CARE_PROVIDER_SITE_OTHER): Payer: Self-pay | Admitting: General Surgery

## 2013-01-13 NOTE — Telephone Encounter (Signed)
I have left a message for this pt on 10/16 and 10/20.  File had been placed in pending.

## 2013-01-15 ENCOUNTER — Other Ambulatory Visit (INDEPENDENT_AMBULATORY_CARE_PROVIDER_SITE_OTHER): Payer: Self-pay | Admitting: General Surgery

## 2013-02-07 ENCOUNTER — Telehealth (INDEPENDENT_AMBULATORY_CARE_PROVIDER_SITE_OTHER): Payer: Self-pay

## 2013-02-07 NOTE — Telephone Encounter (Signed)
The pt called regarding his upcoming surgery.  He has questions.  He wants to know if he will be awake and if he needs someone to bring him.  He also wants to know what he will need to do after surgery at home.  I told him he is scheduled for local anesthesia.  He will be awake and alert.  I told him Dr Carolynne Edouard will inject him with local numbing medication and that usually lasts about 5 to 6 hrs after surgery.  He will be given a script for pain medication.  I told him I don't know what to tell him for aftercare yet because we have to see exactly what Dr Carolynne Edouard does that day.  We will need to know if he sutures the incisions closed or packs them.  He will get instructions that day.  I advised it is best if he has someone bring him and take him home.

## 2013-02-12 ENCOUNTER — Other Ambulatory Visit (INDEPENDENT_AMBULATORY_CARE_PROVIDER_SITE_OTHER): Payer: Self-pay | Admitting: *Deleted

## 2013-02-12 DIAGNOSIS — Q828 Other specified congenital malformations of skin: Secondary | ICD-10-CM

## 2013-02-12 MED ORDER — HYDROCODONE-ACETAMINOPHEN 5-325 MG PO TABS: 1.0000 | ORAL_TABLET | ORAL | Status: DC | PRN

## 2013-02-13 ENCOUNTER — Telehealth (INDEPENDENT_AMBULATORY_CARE_PROVIDER_SITE_OTHER): Payer: Self-pay

## 2013-02-13 ENCOUNTER — Other Ambulatory Visit (INDEPENDENT_AMBULATORY_CARE_PROVIDER_SITE_OTHER): Payer: Self-pay | Admitting: Surgery

## 2013-02-13 ENCOUNTER — Telehealth (INDEPENDENT_AMBULATORY_CARE_PROVIDER_SITE_OTHER): Payer: Self-pay | Admitting: Surgery

## 2013-02-13 DIAGNOSIS — G8918 Other acute postprocedural pain: Secondary | ICD-10-CM

## 2013-02-13 MED ORDER — PROMETHAZINE HCL 12.5 MG PO TABS: 12.5000 mg | ORAL_TABLET | Freq: Four times a day (QID) | ORAL | Status: DC | PRN

## 2013-02-13 NOTE — Telephone Encounter (Signed)
Pt calling b/c the Norco is making him feel very sick on his stomach with nausea. The pt even tried just taking 1/2 of a tab of Norco but not any better. The pt had surgery yesterday by Dr Carolynne Edouard to have mass removed on back and axilla. The pt wants something different for pain. The pt is allergic to Benadryl. The pt did try eating before taking the Norco but that didn't help either. Please call.

## 2013-02-13 NOTE — Telephone Encounter (Signed)
Patient struggling with poor pain control.  Cannot tolerate hydrocodone due to nausea Office closed.  I cannot call in a new Rx.  I recommended he call [facility] office & come by in the AM to get a new Rx sent - switch to oxycodone 5-10mg  PO q4h prn pain #40 refill = 0.  I will e-Rx phenergan as well

## 2013-02-14 ENCOUNTER — Telehealth (INDEPENDENT_AMBULATORY_CARE_PROVIDER_SITE_OTHER): Payer: Self-pay | Admitting: *Deleted

## 2013-02-14 MED ORDER — OXYCODONE HCL 5 MG PO TABS: 5.0000 mg | ORAL_TABLET | ORAL | Status: DC | PRN

## 2013-02-14 NOTE — Telephone Encounter (Signed)
Dr Michaell Cowing spoke to pt last night. Okayed oxycodone. Script to be signed and up front for pick up by 9:30.

## 2013-02-14 NOTE — Addendum Note (Signed)
Addended by: Brennan Bailey on: 02/14/2013 08:57 AM   Modules accepted: Orders

## 2013-02-14 NOTE — Telephone Encounter (Signed)
LMOM for pt to return my call, I was calling pt to notify him of his post op appt with Dr. Carolynne Edouard on 03/04/13 at 9:40am.

## 2013-02-14 NOTE — Telephone Encounter (Signed)
Patient called and was given below message.  Patient will come to pick up prescription.

## 2013-03-01 ENCOUNTER — Other Ambulatory Visit: Payer: Self-pay | Admitting: Internal Medicine

## 2013-03-04 ENCOUNTER — Encounter (INDEPENDENT_AMBULATORY_CARE_PROVIDER_SITE_OTHER): Payer: No Typology Code available for payment source | Admitting: General Surgery

## 2013-03-14 ENCOUNTER — Ambulatory Visit
Admission: RE | Admit: 2013-03-14 | Discharge: 2013-03-14 | Disposition: A | Discharge status: Home / Self Care | Payer: No Typology Code available for payment source | Source: Ambulatory Visit | Attending: Family Medicine | Admitting: Family Medicine

## 2013-03-14 ENCOUNTER — Other Ambulatory Visit: Payer: Self-pay | Admitting: Family Medicine

## 2013-03-14 DIAGNOSIS — M25571 Pain in right ankle and joints of right foot: Secondary | ICD-10-CM

## 2013-03-24 ENCOUNTER — Other Ambulatory Visit: Payer: Self-pay | Admitting: Family Medicine

## 2013-03-24 DIAGNOSIS — M25571 Pain in right ankle and joints of right foot: Secondary | ICD-10-CM

## 2013-03-24 DIAGNOSIS — R9389 Abnormal findings on diagnostic imaging of other specified body structures: Secondary | ICD-10-CM

## 2013-04-04 ENCOUNTER — Other Ambulatory Visit (INDEPENDENT_AMBULATORY_CARE_PROVIDER_SITE_OTHER): Payer: Self-pay | Admitting: Surgery

## 2013-04-18 ENCOUNTER — Other Ambulatory Visit: Payer: No Typology Code available for payment source

## 2013-07-03 ENCOUNTER — Other Ambulatory Visit: Payer: Self-pay | Admitting: Internal Medicine

## 2018-01-03 DIAGNOSIS — Z23 Encounter for immunization: Secondary | ICD-10-CM | POA: Diagnosis not present

## 2018-07-31 ENCOUNTER — Encounter (HOSPITAL_COMMUNITY): Payer: Self-pay | Admitting: Emergency Medicine

## 2018-07-31 ENCOUNTER — Emergency Department (HOSPITAL_COMMUNITY): Payer: BLUE CROSS/BLUE SHIELD

## 2018-07-31 ENCOUNTER — Emergency Department (HOSPITAL_COMMUNITY)
Admission: EM | Admit: 2018-07-31 | Discharge: 2018-07-31 | Disposition: A | Discharge status: Home / Self Care | Payer: BLUE CROSS/BLUE SHIELD | Attending: Emergency Medicine | Admitting: Emergency Medicine

## 2018-07-31 DIAGNOSIS — R0789 Other chest pain: Secondary | ICD-10-CM | POA: Diagnosis not present

## 2018-07-31 DIAGNOSIS — R002 Palpitations: Secondary | ICD-10-CM | POA: Insufficient documentation

## 2018-07-31 DIAGNOSIS — J45909 Unspecified asthma, uncomplicated: Secondary | ICD-10-CM | POA: Diagnosis not present

## 2018-07-31 DIAGNOSIS — R079 Chest pain, unspecified: Secondary | ICD-10-CM | POA: Diagnosis not present

## 2018-07-31 DIAGNOSIS — R000 Tachycardia, unspecified: Secondary | ICD-10-CM | POA: Diagnosis not present

## 2018-07-31 DIAGNOSIS — Z87891 Personal history of nicotine dependence: Secondary | ICD-10-CM | POA: Diagnosis not present

## 2018-07-31 DIAGNOSIS — Z79899 Other long term (current) drug therapy: Secondary | ICD-10-CM | POA: Insufficient documentation

## 2018-07-31 DIAGNOSIS — I10 Essential (primary) hypertension: Secondary | ICD-10-CM | POA: Diagnosis not present

## 2018-07-31 DIAGNOSIS — I471 Supraventricular tachycardia: Secondary | ICD-10-CM | POA: Diagnosis not present

## 2018-07-31 LAB — CBC
HCT: 44.2 % (ref 39.0–52.0)
Hemoglobin: 15.2 g/dL (ref 13.0–17.0)
MCH: 30.5 pg (ref 26.0–34.0)
MCHC: 34.4 g/dL (ref 30.0–36.0)
MCV: 88.8 fL (ref 80.0–100.0)
Platelets: 153 10*3/uL (ref 150–400)
RBC: 4.98 MIL/uL (ref 4.22–5.81)
RDW: 11.7 % (ref 11.5–15.5)
WBC: 3.7 10*3/uL — ABNORMAL LOW (ref 4.0–10.5)
nRBC: 0 % (ref 0.0–0.2)

## 2018-07-31 LAB — BASIC METABOLIC PANEL
Anion gap: 16 — ABNORMAL HIGH (ref 5–15)
BUN: 11 mg/dL (ref 6–20)
CO2: 22 mmol/L (ref 22–32)
Calcium: 8.8 mg/dL — ABNORMAL LOW (ref 8.9–10.3)
Chloride: 100 mmol/L (ref 98–111)
Creatinine, Ser: 0.81 mg/dL (ref 0.61–1.24)
GFR calc Af Amer: >60 mL/min (ref >60)
GFR calc non Af Amer: >60 mL/min (ref >60)
Glucose, Bld: 105 mg/dL — ABNORMAL HIGH (ref 70–99)
Potassium: 4.1 mmol/L (ref 3.5–5.1)
Sodium: 138 mmol/L (ref 135–145)

## 2018-07-31 LAB — TROPONIN I: Troponin I: <0.03 ng/mL (ref <0.03)

## 2018-07-31 MED ORDER — METOPROLOL TARTRATE 25 MG PO TABS: 25.0000 mg | ORAL_TABLET | Freq: Two times a day (BID) | ORAL | 0 refills | Status: DC

## 2018-07-31 NOTE — ED Triage Notes (Signed)
Pt arrives via [facility] from home with c/o chest tightness and palpitations, upon ems arrival, patient's heart rate was 190. Pt received 6mg  adenosine and converted to NSR. Hx of hypertension but no known hx of cardiac arrhythmias. Pt a/ox4, resp e/u,nad. denies chest pain since conversion to NSR.

## 2018-07-31 NOTE — Discharge Instructions (Signed)
Take metoprolol twice a day.  This should help lower your blood pressure, as well as prevent your heart rate from going up. Monitor your symptoms, if you become dizzy or lightheaded, especially upon standing, stop taking the medication and follow-up with cardiology. Make sure you are staying well-hydrated water.  Your urine should be clear to pale yellow. Try to avoid or cut back on your alcohol and caffeine intake. Call the cardiologist office listed below set up a follow-up appointment. Return to the emergency room if you develop chest pain, shortness of breath, recurring symptoms, or any new, worsening, or concerning symptoms.

## 2018-07-31 NOTE — ED Provider Notes (Signed)
[facility] EMERGENCY DEPARTMENT Provider Note   CSN: [id number] Arrival date & time: 07/31/18  2683    History   Chief Complaint Chief Complaint  Patient presents with  . SVT  . Chest Pain    HPI Tom Lee is a 53 y.o. male presenting for evaluation of palpitations.  Patient states he was performing his normal morning routine when he had acute onset of palpitations.  He was unable to get them to stop, even with rest and deep breaths.  Thus EMS was called, and patient was found to be in SVT with a heart rate in the 190s per triage note.  Patient was given 6 mg of adenosine, and converted to normal sinus rhythm.  Patient reports complete cessation of symptoms after converting to normal sinus rhythm.  He denies chest pain throughout the event or currently.  He denies recent fevers, chills, cough, shortness of breath, nausea, vomiting, abdominal pain, urine symptoms, abnormal bowel movements.  He denies change in diet or appetite.  He has been using albuterol inhaler as needed throughout spring season for allergies, did use it this morning just prior to symptoms beginning, however this is not a new medication for him.  He denies taking any other medications.  He denies history of similar.  He has a history of high blood pressure, but does not take any medication for this.  He drinks alcohol occasionally, reports he had several shots last night, which is not abnormal for him.  He denies tobacco or drug use.      HPI  Past Medical History:  Diagnosis Date  . Arthritis   . Asthma    dx as a child, no symptoms x years  . Hypertension 2011    Patient Active Problem List   Diagnosis Date Noted  . Paresthesias 10/28/2012  . Skin lesion 10/28/2012  . General medical examination 08/04/2011  . Anxiety 07/03/2011  . Headache(784.0) 07/03/2011  . BACK PAIN 12/31/2009  . CHEST PAIN 12/14/2009  . HYPERTENSION 11/24/2009    Past Surgical History:  Procedure Laterality  Date  . broken ankle  2008   had surgery  . HERNIA REPAIR  10/2009        Home Medications    Prior to Admission medications   Medication Sig Start Date End Date Taking? Authorizing Provider  albuterol (VENTOLIN HFA) 108 (90 BASE) MCG/ACT inhaler Inhale 2 puffs into the lungs every 6 (six) hours as needed. 03/14/12   Wanda Plump, MD  Ibuprofen (ADVIL) 200 MG CAPS Take by mouth as needed.    [provider]  losartan (COZAAR) 100 MG tablet Take 1 tablet (100 mg total) by mouth daily. 05/29/12   Wanda Plump, MD  metoprolol tartrate (LOPRESSOR) 25 MG tablet Take 1 tablet (25 mg total) by mouth 2 (two) times daily. 07/31/18   Arian Murley, PA-C  oxyCODONE (ROXICODONE) 5 MG immediate release tablet Take 1 tablet (5 mg total) by mouth every 4 (four) hours as needed for severe pain. 02/14/13   Griselda Miner, MD  promethazine (PHENERGAN) 12.5 MG tablet Take 1-2 tablets (12.5-25 mg total) by mouth every 6 (six) hours as needed for nausea. 02/13/13   Karie Soda, MD    Family History Family History  Problem Relation Age of Onset  . Hypertension Mother        CHF  . Stroke Mother        x 2  . Diabetes Mother   . Hypertension  Father   . Heart attack Neg Hx   . Colon cancer Neg Hx   . Prostate cancer Neg Hx     Social History Social History   Tobacco Use  . Smoking status: Former Games developer  . Smokeless tobacco: Never Used  . Tobacco comment: 11-12 years as of 2012  Substance Use Topics  . Alcohol use: No  . Drug use: No     Allergies   Diphenhydramine hcl; Hydrocodone; and Escitalopram oxalate   Review of Systems Review of Systems  Cardiovascular: Positive for palpitations (resolved).  All other systems reviewed and are negative.    Physical Exam Updated Vital Signs BP (!) 142/96   Pulse 83   Temp 98.6 F (37 C) (Oral)   Resp 19   SpO2 96%   Physical Exam Vitals signs and nursing note reviewed.  Constitutional:      General: He is not in acute  distress.    Appearance: He is well-developed.     Comments: Appears nontoxic  HENT:     Head: Normocephalic and atraumatic.  Eyes:     Conjunctiva/sclera: Conjunctivae normal.     Pupils: Pupils are equal, round, and reactive to light.  Neck:     Musculoskeletal: Normal range of motion and neck supple.  Cardiovascular:     Rate and Rhythm: Normal rate and regular rhythm.     Pulses: Normal pulses.     Comments: Regular rate rhythm Pulmonary:     Effort: Pulmonary effort is normal. No respiratory distress.     Breath sounds: Normal breath sounds. No wheezing.     Comments: Speaking in full sentences.  Clear lung sounds in all fields. Abdominal:     General: There is no distension.     Palpations: Abdomen is soft. There is no mass.     Tenderness: There is no abdominal tenderness. There is no guarding or rebound.  Musculoskeletal: Normal range of motion.     Comments: No leg pain or swelling.  Skin:    General: Skin is warm and dry.     Capillary Refill: Capillary refill takes less than 2 seconds.  Neurological:     Mental Status: He is alert and oriented to person, place, and time.      ED Treatments / Results  Labs (all labs ordered are listed, but only abnormal results are displayed) Labs Reviewed  BASIC METABOLIC PANEL - Abnormal; Notable for the following components:      Result Value   Glucose, Bld 105 (*)    Calcium 8.8 (*)    Anion gap 16 (*)    All other components within normal limits  CBC - Abnormal; Notable for the following components:   WBC 3.7 (*)    All other components within normal limits  TROPONIN I    EKG EKG Interpretation  Date/Time:  Wednesday Jul 31 2018 06:48:48 EDT Ventricular Rate:  89 PR Interval:    QRS Duration: 100 QT Interval:  354 QTC Calculation: 431 R Axis:   96 Text Interpretation:  Sinus rhythm Borderline right axis deviation RSR' in V1 or V2, probably normal variant Confirmed by Ross Marcus ([id number]) on 07/31/2018  6:50:50 AM   Radiology Dg Chest Portable 1 View  Result Date: 07/31/2018 CLINICAL DATA:  Chest tightness and palpitations. EXAM: PORTABLE CHEST 1 VIEW COMPARISON:  Chest x-ray dated July 10, 2010. FINDINGS: The heart size and mediastinal contours are within normal limits. Both lungs are clear. The visualized skeletal structures are unremarkable.  IMPRESSION: No active disease. Electronically Signed   By: Obie DredgeWilliam T Derry M.D.   On: 07/31/2018 07:38    Procedures Procedures (including critical care time)  Medications Ordered in ED Medications - No data to display   Initial Impression / Assessment and Plan / ED Course  I have reviewed the triage vital signs and the nursing notes.  Pertinent labs & imaging results that were available during my care of the patient were reviewed by me and considered in my medical decision making (see chart for details).        Patient presenting for evaluation for tachycardia and palpitations.  Physical examination, he is in normal sinus rhythm and no longer having palpitations.  No chest pain.  Patient appears nontoxic.  No obvious precipitating event, however this did occur after using his inhaler.  Consider dehydration versus electrolyte abnormality versus infection.  Will order labs, EKG, x-ray, reassess.  EKG without tachycardia, SVT, or STEMI.  Chest x-ray viewed interpreted by me, no pneumonia, pneumothorax, effusion, cardiomegaly.  Labs reassuring, hemoglobin stable.  Electrolytes stable.  Troponin negative.  Patient without any further recurrences of chest pain.  He is hypertensive, is not on medication for this.  Considering his hypertension and episode of SVT, will start patient on metoprolol, and have him follow-up with cardiology. Pt ambulated without recurrence of tachycardia. Discussed decreasing/cessation of alcohol and caffeine.  Encouraged hydration.  Case discussed with attending, Dr. Clarene DukeLittle evaluated the patient.  At this time, patient appears  safe for discharge.  Return precautions given.  Patient states he understands and agrees with plan.    Final Clinical Impressions(s) / ED Diagnoses   Final diagnoses:  Tachycardia  Palpitations    ED Discharge Orders         Ordered    metoprolol tartrate (LOPRESSOR) 25 MG tablet  2 times daily     07/31/18 0856           Alveria ApleyCaccavale, Mahaila Tischer, PA-C 07/31/18 [id number]    Little, Ambrose Finlandachel Morgan, MD 07/31/18 1049

## 2019-01-02 DIAGNOSIS — Z23 Encounter for immunization: Secondary | ICD-10-CM | POA: Diagnosis not present

## 2019-10-31 DIAGNOSIS — Z20822 Contact with and (suspected) exposure to covid-19: Secondary | ICD-10-CM | POA: Diagnosis not present

## 2019-10-31 DIAGNOSIS — J209 Acute bronchitis, unspecified: Secondary | ICD-10-CM | POA: Diagnosis not present

## 2019-10-31 DIAGNOSIS — R319 Hematuria, unspecified: Secondary | ICD-10-CM | POA: Diagnosis not present

## 2019-10-31 DIAGNOSIS — L259 Unspecified contact dermatitis, unspecified cause: Secondary | ICD-10-CM | POA: Diagnosis not present

## 2019-11-27 DIAGNOSIS — I10 Essential (primary) hypertension: Secondary | ICD-10-CM | POA: Diagnosis not present

## 2019-11-27 DIAGNOSIS — R519 Headache, unspecified: Secondary | ICD-10-CM | POA: Diagnosis not present

## 2019-11-27 DIAGNOSIS — R319 Hematuria, unspecified: Secondary | ICD-10-CM | POA: Diagnosis not present

## 2019-12-19 DIAGNOSIS — R768 Other specified abnormal immunological findings in serum: Secondary | ICD-10-CM | POA: Diagnosis not present

## 2019-12-19 DIAGNOSIS — I10 Essential (primary) hypertension: Secondary | ICD-10-CM | POA: Diagnosis not present

## 2019-12-19 DIAGNOSIS — M2550 Pain in unspecified joint: Secondary | ICD-10-CM | POA: Diagnosis not present

## 2019-12-19 DIAGNOSIS — N529 Male erectile dysfunction, unspecified: Secondary | ICD-10-CM | POA: Diagnosis not present

## 2019-12-19 DIAGNOSIS — F5101 Primary insomnia: Secondary | ICD-10-CM | POA: Diagnosis not present

## 2020-02-02 DIAGNOSIS — R5383 Other fatigue: Secondary | ICD-10-CM | POA: Diagnosis not present

## 2020-02-02 DIAGNOSIS — M2550 Pain in unspecified joint: Secondary | ICD-10-CM | POA: Diagnosis not present

## 2020-02-03 DIAGNOSIS — Z23 Encounter for immunization: Secondary | ICD-10-CM | POA: Diagnosis not present

## 2020-02-16 DIAGNOSIS — M7989 Other specified soft tissue disorders: Secondary | ICD-10-CM | POA: Diagnosis not present

## 2020-02-16 DIAGNOSIS — M0579 Rheumatoid arthritis with rheumatoid factor of multiple sites without organ or systems involvement: Secondary | ICD-10-CM | POA: Diagnosis not present

## 2020-04-23 DIAGNOSIS — M25531 Pain in right wrist: Secondary | ICD-10-CM | POA: Diagnosis not present

## 2020-04-23 DIAGNOSIS — M0579 Rheumatoid arthritis with rheumatoid factor of multiple sites without organ or systems involvement: Secondary | ICD-10-CM | POA: Diagnosis not present

## 2020-04-23 DIAGNOSIS — M7989 Other specified soft tissue disorders: Secondary | ICD-10-CM | POA: Diagnosis not present

## 2020-05-10 ENCOUNTER — Other Ambulatory Visit: Payer: Self-pay | Admitting: Internal Medicine

## 2020-05-10 DIAGNOSIS — M25531 Pain in right wrist: Secondary | ICD-10-CM

## 2020-06-08 ENCOUNTER — Other Ambulatory Visit: Payer: Self-pay

## 2020-06-08 ENCOUNTER — Ambulatory Visit
Admission: RE | Admit: 2020-06-08 | Discharge: 2020-06-08 | Disposition: A | Discharge status: Home / Self Care | Payer: No Typology Code available for payment source | Source: Ambulatory Visit | Attending: Internal Medicine | Admitting: Internal Medicine

## 2020-06-08 DIAGNOSIS — M25531 Pain in right wrist: Secondary | ICD-10-CM

## 2020-09-24 DIAGNOSIS — M0579 Rheumatoid arthritis with rheumatoid factor of multiple sites without organ or systems involvement: Secondary | ICD-10-CM | POA: Diagnosis not present

## 2020-09-24 DIAGNOSIS — M25531 Pain in right wrist: Secondary | ICD-10-CM | POA: Diagnosis not present

## 2021-04-20 ENCOUNTER — Emergency Department (HOSPITAL_COMMUNITY): Payer: BC Managed Care – PPO

## 2021-04-20 ENCOUNTER — Observation Stay (HOSPITAL_COMMUNITY)
Admission: EM | Admit: 2021-04-20 | Discharge: 2021-04-21 | Disposition: A | Discharge status: Home / Self Care | Payer: BC Managed Care – PPO | Attending: Internal Medicine | Admitting: Internal Medicine

## 2021-04-20 ENCOUNTER — Other Ambulatory Visit: Payer: Self-pay

## 2021-04-20 ENCOUNTER — Other Ambulatory Visit (HOSPITAL_COMMUNITY): Payer: BC Managed Care – PPO

## 2021-04-20 ENCOUNTER — Inpatient Hospital Stay (HOSPITAL_BASED_OUTPATIENT_CLINIC_OR_DEPARTMENT_OTHER): Payer: BC Managed Care – PPO

## 2021-04-20 ENCOUNTER — Encounter (HOSPITAL_COMMUNITY): Payer: Self-pay

## 2021-04-20 ENCOUNTER — Inpatient Hospital Stay (HOSPITAL_COMMUNITY): Payer: BC Managed Care – PPO

## 2021-04-20 DIAGNOSIS — Z79899 Other long term (current) drug therapy: Secondary | ICD-10-CM | POA: Insufficient documentation

## 2021-04-20 DIAGNOSIS — K5900 Constipation, unspecified: Secondary | ICD-10-CM | POA: Diagnosis not present

## 2021-04-20 DIAGNOSIS — J45909 Unspecified asthma, uncomplicated: Secondary | ICD-10-CM | POA: Insufficient documentation

## 2021-04-20 DIAGNOSIS — M1990 Unspecified osteoarthritis, unspecified site: Secondary | ICD-10-CM | POA: Diagnosis present

## 2021-04-20 DIAGNOSIS — M47812 Spondylosis without myelopathy or radiculopathy, cervical region: Secondary | ICD-10-CM | POA: Diagnosis not present

## 2021-04-20 DIAGNOSIS — I10 Essential (primary) hypertension: Secondary | ICD-10-CM | POA: Diagnosis present

## 2021-04-20 DIAGNOSIS — Z87891 Personal history of nicotine dependence: Secondary | ICD-10-CM | POA: Diagnosis not present

## 2021-04-20 DIAGNOSIS — I639 Cerebral infarction, unspecified: Principal | ICD-10-CM | POA: Diagnosis present

## 2021-04-20 DIAGNOSIS — Z8673 Personal history of transient ischemic attack (TIA), and cerebral infarction without residual deficits: Secondary | ICD-10-CM | POA: Diagnosis not present

## 2021-04-20 DIAGNOSIS — I6389 Other cerebral infarction: Secondary | ICD-10-CM

## 2021-04-20 DIAGNOSIS — K056 Periodontal disease, unspecified: Secondary | ICD-10-CM | POA: Diagnosis not present

## 2021-04-20 DIAGNOSIS — R42 Dizziness and giddiness: Secondary | ICD-10-CM | POA: Diagnosis not present

## 2021-04-20 DIAGNOSIS — K089 Disorder of teeth and supporting structures, unspecified: Secondary | ICD-10-CM | POA: Diagnosis not present

## 2021-04-20 LAB — CBC
HCT: 43.1 % (ref 39.0–52.0)
Hemoglobin: 14.2 g/dL (ref 13.0–17.0)
MCH: 27.6 pg (ref 26.0–34.0)
MCHC: 32.9 g/dL (ref 30.0–36.0)
MCV: 83.7 fL (ref 80.0–100.0)
Platelets: 162 10*3/uL (ref 150–400)
RBC: 5.15 MIL/uL (ref 4.22–5.81)
RDW: 13.2 % (ref 11.5–15.5)
WBC: 4.2 10*3/uL (ref 4.0–10.5)
nRBC: 0 % (ref 0.0–0.2)

## 2021-04-20 LAB — ECHOCARDIOGRAM COMPLETE
AR max vel: 2.22 cm2
AV Area VTI: 2.24 cm2
AV Area mean vel: 2.1 cm2
AV Mean grad: 3 mmHg
AV Peak grad: 5.2 mmHg
Ao pk vel: 1.14 m/s
Area-P 1/2: 3.33 cm2
Height: 71 in
S' Lateral: 3 cm
Weight: 3440 oz

## 2021-04-20 LAB — BASIC METABOLIC PANEL
Anion gap: 8 (ref 5–15)
BUN: 13 mg/dL (ref 6–20)
CO2: 25 mmol/L (ref 22–32)
Calcium: 9.1 mg/dL (ref 8.9–10.3)
Chloride: 105 mmol/L (ref 98–111)
Creatinine, Ser: 0.8 mg/dL (ref 0.61–1.24)
GFR, Estimated: >60 mL/min (ref >60)
Glucose, Bld: 97 mg/dL (ref 70–99)
Potassium: 3.7 mmol/L (ref 3.5–5.1)
Sodium: 138 mmol/L (ref 135–145)

## 2021-04-20 LAB — MAGNESIUM: Magnesium: 2 mg/dL (ref 1.7–2.4)

## 2021-04-20 MED ORDER — ACETAMINOPHEN 650 MG RE SUPP: 650.0000 mg | Freq: Four times a day (QID) | RECTAL | Status: DC | PRN

## 2021-04-20 MED ORDER — ONDANSETRON HCL 4 MG/2ML IJ SOLN
4.0000 mg | Freq: Once | INTRAMUSCULAR | Status: AC
  Administered 2021-04-20: 10:00:00 4 mg via INTRAVENOUS
  Filled 2021-04-20: qty 2

## 2021-04-20 MED ORDER — STROKE: EARLY STAGES OF RECOVERY BOOK
Freq: Once | Status: AC
  Filled 2021-04-20: qty 1

## 2021-04-20 MED ORDER — ONDANSETRON HCL 4 MG/2ML IJ SOLN
4.0000 mg | Freq: Four times a day (QID) | INTRAMUSCULAR | Status: DC | PRN
  Administered 2021-04-21: 4 mg via INTRAVENOUS
  Filled 2021-04-20: qty 2

## 2021-04-20 MED ORDER — SODIUM CHLORIDE (PF) 0.9 % IJ SOLN
INTRAMUSCULAR | Status: AC
  Filled 2021-04-20: qty 50

## 2021-04-20 MED ORDER — ONDANSETRON HCL 4 MG PO TABS: 4.0000 mg | ORAL_TABLET | Freq: Four times a day (QID) | ORAL | Status: DC | PRN

## 2021-04-20 MED ORDER — ACETAMINOPHEN 325 MG PO TABS
650.0000 mg | ORAL_TABLET | Freq: Four times a day (QID) | ORAL | Status: DC | PRN
  Administered 2021-04-20 – 2021-04-21 (×2): 650 mg via ORAL
  Filled 2021-04-20 (×2): qty 2

## 2021-04-20 MED ORDER — SENNA 8.6 MG PO TABS: 1.0000 | ORAL_TABLET | Freq: Every evening | ORAL | Status: DC | PRN

## 2021-04-20 MED ORDER — IOHEXOL 350 MG/ML SOLN
80.0000 mL | Freq: Once | INTRAVENOUS | Status: AC | PRN
Start: 2021-04-20 — End: 2021-04-20
  Administered 2021-04-20: 10:00:00 80 mL via INTRAVENOUS

## 2021-04-20 NOTE — Plan of Care (Signed)
ON CALL PHONE CONSULT  Call from [place], [place] @ 0830   Discussion - LKW 2d ago, MRI with cerebellar strokes. Needs admit to [facility] hospitalist with neurology consulting. CTA head and neck Stroke show petechial blood - should be OK for permissive HTN for another day or so. Discussed with Dr. Durwin Nora and Marva Panda PA Will see at [facility] if schedule permits or upon arrival at [facility].  -- Milon Dikes, MD Neurologist [facility] Pager: [phone]

## 2021-04-20 NOTE — Consult Note (Addendum)
Neurology Consultation  Reason for Consult: Stroke Referring Physician: Iline Oven, [place]  CC: Dizzy  History is obtained from: Patient   HPI: Tom Lee is a 56 y.o. male with past medical history of osteoarthritis, mild childhood asthma, SVT in May 2020, hypertension who had not been taking his antihypertensives (amlodipine) in the past 2 months due to bilateral lower extremity cramps who presents today reporting 2 days of fluctuating dizziness that affected his vision. He states that this began Monday and he was feeling "sluggish". He states that he did a "detox" with dulcolax and then thought he was just dehydrated. He tried to push water and Pedialyte and then went to bed feeling a little better, but still slightly dizzy. On Tuesday at about 0130 in the morning he woke up to the room spinning and he took his blood pressure. He noted that his BPs read 198/105, 199/110. He then took 10mg  of his amlodipine in the morning and 10mg  of amlodipine at night. He retook his blood pressure Tuesday night and got readings of 185/101, 177/95, and 197/105 and decided to come to the ED because of his hypertension and dizziness. He denied headache, unilateral weakness or paresthesias, difficulty finding words, comprehending, talking or slurred speech.  No fever, chills, sore throat, rhinorrhea, dyspnea, chest pain, palpitations, PND, orthopnea, but get occasional lower extremity edema.  He has been getting constipation.  He has been using kratom, but denied abdominal pain, diarrhea, melena or hematochezia.  No dysuria, flank pain, frequency or hematuria.  No polyuria, polyphagia or blurred vision. He is currently not having any dizziness. He does report the last occurrence as 0830 this morning.   LKW: Monday TNK given?: no, outside window.  IR Thrombectomy? No, no LVO.  Modified Rankin Scale: 0-Completely asymptomatic and back to baseline post- stroke  ROS: A complete ROS was performed and is negative except as  noted in the HPI.   Past Medical History:  Diagnosis Date   Arthritis    Asthma    dx as a child, no symptoms x years   Hypertension 2011    Family History  Problem Relation Age of Onset   Hypertension Mother        CHF   Stroke Mother        x 2   Diabetes Mother    Hypertension Father    Stroke Brother    Heart attack Neg Hx    Colon cancer Neg Hx    Prostate cancer Neg Hx     Social History:  He reports that he has never smoked. He does report drinking approximately 4 drinks per week.   Medications  Current Facility-Administered Medications:    acetaminophen (TYLENOL) tablet 650 mg, 650 mg, Oral, Q6H PRN **OR** acetaminophen (TYLENOL) suppository 650 mg, 650 mg, Rectal, Q6H PRN, Reubin Milan, MD   ondansetron [facility]) tablet 4 mg, 4 mg, Oral, Q6H PRN **OR** ondansetron (ZOFRAN) injection 4 mg, 4 mg, Intravenous, Q6H PRN, Reubin Milan, MD   senna [facility]) tablet 8.6 mg, 1 tablet, Oral, QHS PRN, Reubin Milan, MD   sodium chloride (PF) 0.9 % injection, , , ,   Current Outpatient Medications:    albuterol (VENTOLIN HFA) 108 (90 BASE) MCG/ACT inhaler, Inhale 2 puffs into the lungs every 6 (six) hours as needed., Disp: 18 g, Rfl: 1   Ibuprofen (ADVIL) 200 MG CAPS, Take by mouth as needed., Disp: , Rfl:    losartan (COZAAR) 100 MG tablet, Take 1 tablet (100  mg total) by mouth daily., Disp: 90 tablet, Rfl: 3   metoprolol tartrate (LOPRESSOR) 25 MG tablet, Take 1 tablet (25 mg total) by mouth 2 (two) times daily., Disp: 60 tablet, Rfl: 0   oxyCODONE (ROXICODONE) 5 MG immediate release tablet, Take 1 tablet (5 mg total) by mouth every 4 (four) hours as needed for severe pain., Disp: 40 tablet, Rfl: 0   promethazine (PHENERGAN) 12.5 MG tablet, Take 1-2 tablets (12.5-25 mg total) by mouth every 6 (six) hours as needed for nausea., Disp: 20 tablet, Rfl: 3   Exam: Current vital signs: BP (!) 187/105    Pulse 80    Temp 98 F (36.7 C) (Oral)    Resp 19    Ht  5\' 11"  (1.803 m)    Wt 97.5 kg    SpO2 98%    BMI 29.99 kg/m  Vital signs in last 24 hours: Temp:  [98 F (36.7 C)] 98 F (36.7 C) (01/25 0306) Pulse Rate:  [75-80] 80 (01/25 0938) Resp:  [17-20] 19 (01/25 0938) BP: (169-192)/(96-113) 187/105 (01/25 0938) SpO2:  [96 %-98 %] 98 % (01/25 0938) Weight:  [97.5 kg] 97.5 kg (01/25 0931)  GENERAL: Awake, alert, African American male in no acute distress Psych: Affect appropriate for situation, patient is calm and cooperative with examination Head: Normocephalic and atraumatic, without obvious abnormality EENT: Normal conjunctivae, dry mucous membranes, no OP obstruction LUNGS: Normal respiratory effort. Non-labored breathing on room air CV: Regular rate and rhythm on telemetry ABDOMEN: Soft, non-tender, non-distended Extremities: warm, well perfused, without obvious deformity, no edema  NEURO:  Mental Status: Awake, alert, and oriented to person, place, time, and situation. Follows commands.  He is able to provide a clear and coherent history of present illness. Speech/Language: speech is clear and fluent.   Naming, repetition, fluency, and comprehension intact without aphasia .  No neglect is noted Cranial Nerves:  II: PERRL 58mm/brisk. visual fields full.  III, IV, VI: EOMI. Lid elevation symmetric and full.  V: Sensation is intact to light touch and symmetrical to face. Blinks to threat. Moves jaw back and forth.  VII: Face is symmetric resting and smiling. Able to puff cheeks and raise eyebrows.  VIII: Hearing intact to voice IX, X: Palate elevation is symmetric. Phonation normal.  XI: Normal sternocleidomastoid and trapezius muscle strength XII: Tongue protrudes midline without fasciculations.   Motor: 5/5 strength is all muscle groups.  Tone is normal. Bulk is normal.  Sensation: Intact to light touch bilaterally in all four extremities. No extinction to DSS present.  Coordination: FTN intact bilaterally. HKS intact bilaterally.  No pronator drift. Alternating hand movements unremarkable.   DTRs: 2+ throughout.  Gait: Steady, Romberg negative   NIHSS: 1a Level of Conscious.: 0 1b LOC Questions: 0 1c LOC Commands: 0 2 Best Gaze: 0 3 Visual: 0 4 Facial Palsy: 0 5a Motor Arm - left: 0 5b Motor Arm - Right: 0 6a Motor Leg - Left: 0 6b Motor Leg - Right: 0 7 Limb Ataxia: 0 8 Sensory: 0 9 Best Language: 0 10 Dysarthria: 0 11 Extinct. and Inatten.: 0 TOTAL: 0  Labs I have reviewed labs in epic and the results pertinent to this consultation are:  CBC    Component Value Date/Time   WBC 4.2 04/20/2021 0312   RBC 5.15 04/20/2021 0312   HGB 14.2 04/20/2021 0312   HCT 43.1 04/20/2021 0312   PLT 162 04/20/2021 0312   MCV 83.7 04/20/2021 0312   MCH 27.6 04/20/2021  0312   MCHC 32.9 04/20/2021 0312   RDW 13.2 04/20/2021 0312   LYMPHSABS 1.1 08/04/2011 1317   MONOABS 0.6 08/04/2011 1317   EOSABS 0.0 08/04/2011 1317   BASOSABS 0.0 08/04/2011 1317    CMP     Component Value Date/Time   NA 138 04/20/2021 0312   K 3.7 04/20/2021 0312   CL 105 04/20/2021 0312   CO2 25 04/20/2021 0312   GLUCOSE 97 04/20/2021 0312   BUN 13 04/20/2021 0312   CREATININE 0.80 04/20/2021 0312   CALCIUM 9.1 04/20/2021 0312   PROT 7.4 08/04/2011 1317   ALBUMIN 4.5 08/04/2011 1317   AST 27 08/04/2011 1317   ALT 33 08/04/2011 1317   ALKPHOS 59 08/04/2011 1317   BILITOT 0.7 08/04/2011 1317   GFRNONAA >60 04/20/2021 0312   GFRAA >60 07/31/2018 0706    Imaging I have reviewed the images obtained:  CT Head- Age-indeterminate left cerebellar infarct  CTA Head and Neck- Patent vasculature of the head and neck with no significant stenosis, occlusion, or dissection. 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 Diminutive right vertebral artery with a dominant left vertebral artery. Dental disease as above.   MRI- Acute infarcts in the bilateral cerebellum with petechial hemorrhage on the left. 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 Mild chronic white matter disease.   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 On examination he does not present with any focal neurological deficits. No ataxia noted. He is able to sit and stand without reporting any dizziness. Romberg test negative. He states that his last episode of dizziness was at 0830. Last BP on monitor was 187/105. Plan to hold ASA and plavix until after morning CT due to petechial hemorrhage. Stroke team to follow.   Impression: -Cerebellar infarct with expected dizziness.  -HTN.   Recommendations: - HgbA1c, goal A1C <7.0 - fasting lipid panel, goal LDL <70 -High intensity statin if LDL over 70.  - CT in 24 hours to evaluate stroke and petechial hemorrhage - Frequent neuro checks - Echocardiogram pending.  - Prophylactic therapy-Antiplatelet med: start after repeat CT  - ASA 81 and plavix 75mg -stroke team will decide.   - Risk factor modification -stroke education.  -Permissive HTN for one more day, then hypertensive medications with goal <130/90.  -Please do not prescribe Norvasc as he has leg cramps and will likely be non adherent.  - Telemetry monitoring - PT consult, OT consult, Speech consult - Stroke team to follow  Patient seen by D. Charlean Merl, NP and Clance Boll, MSN, APN-BC Neurology Nurse Practitioners Pager [phone] Exam and treatment plan to be edited by MD as needed.   Attending Neurohospitalist Addendum Patient seen and examined with APP/Resident. Agree with the history and physical as documented above. Agree with the plan as documented, which I helped formulate. I have independently reviewed the chart, obtained history, review of systems and examined the patient.I have personally reviewed pertinent head/neck/spine imaging (CT/MRI). Please feel free to call with any questions.  -- Amie Portland, MD Neurologist [facility] Pager: [phone]

## 2021-04-20 NOTE — Plan of Care (Signed)
?  Problem: Education: ?Goal: Knowledge of General Education information will improve ?Description: Including pain rating scale, medication(s)/side effects and non-pharmacologic comfort measures ?Outcome: Progressing ?  ?Problem: Health Behavior/Discharge Planning: ?Goal: Ability to manage health-related needs will improve ?Outcome: Progressing ?  ?Problem: Clinical Measurements: ?Goal: Ability to maintain clinical measurements within normal limits will improve ?Outcome: Progressing ?Goal: Diagnostic test results will improve ?Outcome: Progressing ?  ?Problem: Activity: ?Goal: Risk for activity intolerance will decrease ?Outcome: Progressing ?  ?Problem: Nutrition: ?Goal: Adequate nutrition will be maintained ?Outcome: Progressing ?  ?

## 2021-04-20 NOTE — Evaluation (Signed)
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  Pt with hx of OA and HTN.   Clinical Impression  Pt admitted with above diagnosis.  At baseline, pt independent and works.  Today, pt demonstrating mobility (ambulation, transfers, and stairs) and independent level.  He scored a 24/24 on the DGI indicating low fall risk.  Also, performed higher level balance task including standing on balance disk and toe tapping without LOB -  pt reports possibly slightly harder than baseline but was able to do.  He had some mild dizziness with activity.  Educated on focus points and segmental turns if needed. Pt reports dizziness is gradually improving since admission.  Feel that pt will progress without further skilled PT intervention.  Discussed habituation exercises of head turns.  Also, educated that if he does not feel that he is progressing on his own after 1-2 weeks at home could consider outpt PT (order from PCP) for high level balance training.  Also, informed pt if worsens during hospital stay to please request PT re-consult.  Pt verbalizing and agreeing to all.      Recommendations for follow up therapy are one component of a multi-disciplinary discharge planning process, led by the attending physician.  Recommendations may be updated based on patient status, additional functional criteria and insurance authorization.  Follow Up Recommendations No PT follow up    Assistance Recommended at Discharge PRN  Patient can return home with the following       Equipment Recommendations None recommended by PT  Recommendations for Other Services       Functional Status Assessment Patient has not had a recent decline in their functional status      Precautions / Restrictions Precautions Precautions: None      Mobility  Bed Mobility Overal bed mobility: Independent Bed Mobility: Supine to Sit, Sit to Supine     Supine to sit: Independent Sit to supine: Independent        Transfers Overall transfer level: Independent   Transfers: Sit to/from Stand Sit to Stand: Independent           General transfer comment: Had supervision during therapy but performed at I level    Ambulation/Gait Ambulation/Gait assistance: Supervision Gait Distance (Feet): 400 Feet Assistive device: None Gait Pattern/deviations: WFL(Within Functional Limits)       General Gait Details: Slightly slowed gait speed but overall WFL; did report mild dizziness  Stairs Stairs: Yes Stairs assistance: Supervision Stair Management: No rails, Alternating pattern, Forwards Number of Stairs: 6 General stair comments: performed without difficulty  Wheelchair Mobility    Modified Rankin (Stroke Patients Only) Modified Rankin (Stroke Patients Only) Pre-Morbid Rankin Score: No symptoms Modified Rankin: No significant disability     Balance   Sitting-balance support: No upper extremity supported Sitting balance-Leahy Scale: Normal     Standing balance support: No upper extremity supported Standing balance-Leahy Scale: Good Standing balance comment: static stance and gait without UE support             High level balance activites: Side stepping, Direction changes, Turns, Sudden stops, Head turns High Level Balance Comments: All above without difficulty; also turned circle, tapped step up, stood on balance disc and able to shift weight R/L and A/P (did use  rail to step onto/off of disc) Standardized Balance Assessment Standardized Balance Assessment : Dynamic Gait Index   Dynamic Gait Index Level Surface: Normal Change in Gait Speed: Normal Gait with Horizontal Head Turns: Normal Gait with Vertical Head Turns: Normal Gait and  Pivot Turn: Normal Step Over Obstacle: Normal Step Around Obstacles: Normal Steps: Normal Total Score: 24       Pertinent Vitals/Pain Pain Assessment Pain Assessment: 0-10 Pain Score: 7  Pain Location: headache Pain Descriptors / Indicators: Aching Pain Intervention(s): Limited activity within patient's tolerance, Monitored during session, Patient requesting pain meds-RN notified    Home Living Family/patient expects to be discharged to:: Private residence Living Arrangements: Spouse/significant other Available Help at Discharge: Family;Available 24 hours/day Type of Home: House Home Access: Level entry     Alternate Level Stairs-Number of Steps: flight Home Layout: Two level;Bed/bath upstairs Home Equipment: None      Prior Function Prior Level of Function : Independent/Modified Independent;Driving;Working/employed             Mobility Comments: works as Therapist, music Extremity Assessment Upper Extremity Assessment: Overall WFL for tasks assessed    Lower Extremity Assessment Lower Extremity Assessment: Overall WFL for tasks assessed (ROM WFL; MMT 5/5; coordination equal bil with heel/shin; sensation intact)    Cervical / Trunk Assessment Cervical / Trunk Assessment: Normal  Communication   Communication: No difficulties  Cognition Arousal/Alertness: Awake/alert Behavior During Therapy: WFL for tasks assessed/performed Overall Cognitive Status: Within Functional Limits for tasks assessed                                 General Comments: intact, normal, follows multistep commands; no deficits noted        General Comments  EOEM: intact , no nystagmus, no dizziness Saccades: intact, no dizziness [place] stabilization: intact no dizziness Head Turns: mild dizziness     Exercises     Assessment/Plan    PT Assessment Patient does not need any further PT services  PT  Problem List         PT Treatment Interventions      PT Goals (Current goals can be found in the Care Plan section)  Acute Rehab PT Goals Patient Stated Goal: return home and work - back to normal PT Goal Formulation: All assessment and education complete, DC therapy    Frequency       Co-evaluation               AM-PAC PT "6 Clicks" Mobility  Outcome Measure Help needed turning from your back to your side while in a flat bed without using bedrails?: None Help needed moving from lying on your back to sitting on the side of a flat bed without using bedrails?: None Help needed moving to and from a bed to a chair (including a wheelchair)?: None Help needed standing up from a chair using your arms (e.g., wheelchair or bedside chair)?: None Help needed to walk in hospital room?: None Help needed climbing 3-5 steps with a railing? : A Little 6 Click Score: 23    End of Session Equipment Utilized During Treatment: Gait belt Activity Tolerance: Patient tolerated treatment well Patient left: in bed;with call bell/phone within reach;with family/visitor present Nurse Communication: Mobility status PT Visit Diagnosis: Other abnormalities of gait and mobility (R26.89)  Time: [id number] PT Time Calculation (min) (ACUTE ONLY): 17 min   Charges:   PT Evaluation $PT Eval Low Complexity: 1 Low          Marieli Rudy, PT Acute Rehab Services Pager [phone] [facility] [phone]   Rayetta HumphreyDacia H Charolette Bultman 04/20/2021, 6:02 PM

## 2021-04-20 NOTE — H&P (Signed)
History and Physical    HICKMAN EMSWILER ALP:[id number] DOB: 06/15/1965 DOA: 04/20/2021  PCP: Soundra Pilon, FNP   Patient coming from: Home.  I have personally briefly reviewed patient's old medical records in [facility]  Chief Complaint: Dizziness and nausea with high blood pressure.  HPI: Tom Lee is a 56 y.o. male with medical history significant of osteoarthritis, mild childhood asthma, hypertension who had not been taking his antihypertensives in the past 2 months due to bilateral lower extremity cramps who is coming to the emergency department complaints of 2 days of on and off dizziness which he described as riding a merry-go-round associated with nausea but no episodes of emesis so far.  His vision has been getting blurry, particularly when  he tries to read something.  He took four 5 mg amlodipine tablets yesterday without significant changes in his symptoms.  He went to bed last night and felt that her room was spinning around him.  He had trouble getting up going to the bathroom due to unsteady gait.  He denied headache, unilateral weakness or paresthesias, difficulty finding words, comprehending, talking or slurred speech.  No fever, chills, sore throat, rhinorrhea, dyspnea, chest pain, palpitations, PND, orthopnea, but get occasional lower extremity edema.  He has been getting constipation.  He has been using kratom, but denied abdominal pain, diarrhea, melena or hematochezia.  No dysuria, flank pain, frequency or hematuria.  No polyuria, polyphagia or blurred vision.  He tried drinking a lot of water to help with his symptoms with no changes in them.  ED Course: Initial vital signs were temperature 98 F, pulse 80, respiration 18, BP 192/96 mmHg and O2 sat 96% on room air.  Lab work: CBC and BMP were unremarkable.  Imaging: CT head without contrast show age-indeterminate left cerebellar infarct.  MRI of the brain without contrast show acute infarcts in the bilateral  cerebellum with petechial hemorrhage on the left.  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  There is mild chronic white matter disease.  Vessel imaging was recommended.  CTA head and neck has already been ordered by the ED provider.  Please see images and full radiology report for further details.  Review of Systems: As per HPI otherwise all other systems reviewed and are negative.  Past Medical History:  Diagnosis Date   Arthritis    Asthma    dx as a child, no symptoms x years   Hypertension 2011   Past Surgical History:  Procedure Laterality Date   broken ankle  2008   had surgery   HERNIA REPAIR  10/2009   Social History  reports that he has quit smoking. He has never used smokeless tobacco. He reports that he does not drink alcohol and does not use drugs.  Allergies  Allergen Reactions   Diphenhydramine Hcl     REACTION: increased swelling and hives   Hydrocodone Nausea Only   Diphenhydramine Other (See Comments)   Escitalopram Oxalate     Family History  Problem Relation Age of Onset   Hypertension Mother        CHF   Stroke Mother        x 2   Diabetes Mother    Hypertension Father    Stroke Brother    Heart attack Neg Hx    Colon cancer Neg Hx    Prostate cancer Neg Hx    Prior to Admission medications   Medication Sig Start Date End  Date Taking? Authorizing Provider  albuterol (VENTOLIN HFA) 108 (90 BASE) MCG/ACT inhaler Inhale 2 puffs into the lungs every 6 (six) hours as needed. 03/14/12   Colon Branch, MD  Ibuprofen (ADVIL) 200 MG CAPS Take by mouth as needed.    [provider]  losartan (COZAAR) 100 MG tablet Take 1 tablet (100 mg total) by mouth daily. 05/29/12   Colon Branch, MD  metoprolol tartrate (LOPRESSOR) 25 MG tablet Take 1 tablet (25 mg total) by mouth 2 (two) times daily. 07/31/18   Caccavale, Sophia, PA-C  oxyCODONE (ROXICODONE) 5 MG immediate release tablet Take 1 tablet (5 mg total) by mouth every  4 (four) hours as needed for severe pain. 02/14/13   Jovita Kussmaul, MD  promethazine (PHENERGAN) 12.5 MG tablet Take 1-2 tablets (12.5-25 mg total) by mouth every 6 (six) hours as needed for nausea. 02/13/13   Michael Boston, MD    Physical Exam: Vitals:   04/20/21 0306 04/20/21 0600 04/20/21 0825  BP: (!) 192/96 (!) 169/101 (!) 180/113  Pulse: 80 77 75  Resp: 18 17 20   Temp: 98 F (36.7 C)    TempSrc: Oral    SpO2: 96% 96% 98%    Constitutional: NAD, calm, comfortable. Eyes: PERRL, lids and conjunctivae normal.  Moderate bilateral conjunctival injection. ENMT: Mucous membranes are moist. Posterior pharynx clear of any exudate or lesions. Neck: normal, supple, no masses, no thyromegaly Respiratory: clear to auscultation bilaterally, no wheezing, no crackles. Normal respiratory effort. No accessory muscle use.  Cardiovascular: Regular rate and rhythm, no murmurs / rubs / gallops. No extremity edema. 2+ pedal pulses. No carotid bruits.  Abdomen: No distention.  Soft, no tenderness, no masses palpated. No hepatosplenomegaly. Bowel sounds positive.  Musculoskeletal: no clubbing / cyanosis.  Good ROM, no contractures. Normal muscle tone.  Skin: no acute rashes, lesions, ulcers on very limited dermatological examination. Neurologic: CN 2-12 grossly intact. Sensation intact, DTR normal. Strength 5/5 in all 4.  Mild bilateral tremor during nose to finger coordination test. Psychiatric: Normal judgment and insight. Alert and oriented x 3. Normal mood.   Labs on Admission: I have personally reviewed following labs and imaging studies  CBC: Recent Labs  Lab 04/20/21 0312  WBC 4.2  HGB 14.2  HCT 43.1  MCV 83.7  PLT 0000000    Basic Metabolic Panel: Recent Labs  Lab 04/20/21 0312  NA 138  K 3.7  CL 105  CO2 25  GLUCOSE 97  BUN 13  CREATININE 0.80  CALCIUM 9.1    GFR: CrCl cannot be calculated (Unknown ideal weight.).  Liver Function Tests: No results for input(s): AST, ALT,  ALKPHOS, BILITOT, PROT, ALBUMIN in the last 168 hours.  Radiological Exams on Admission: CT HEAD WO CONTRAST (5MM)  Result Date: 04/20/2021 CLINICAL DATA:  Dizziness.  Uncontrolled blood pressure. EXAM: CT HEAD WITHOUT CONTRAST TECHNIQUE: Contiguous axial images were obtained from the base of the skull through the vertex without intravenous contrast. RADIATION DOSE REDUCTION: This exam was performed according to the departmental dose-optimization program which includes automated exposure control, adjustment of the mA and/or kV according to patient size and/or use of iterative reconstruction technique. COMPARISON:  07/19/2011 FINDINGS: Brain: Small low-density in the left paramedian cerebellum which is interval. No hemorrhage, hydrocephalus, or masslike finding. Vascular: No hyperdense vessel or unexpected calcification. Skull: Normal. Negative for fracture or focal lesion. Sinuses/Orbits: No acute finding. IMPRESSION: Age-indeterminate left cerebellar infarct, suggest MR characterization in the setting of dizziness. Electronically Signed  By: Jorje Guild M.D.   On: 04/20/2021 04:21   MR BRAIN WO CONTRAST  Result Date: 04/20/2021 CLINICAL DATA:  Nonspecific dizziness. EXAM: MRI HEAD WITHOUT CONTRAST TECHNIQUE: Multiplanar, multiecho pulse sequences of the brain and surrounding structures were obtained without intravenous contrast. COMPARISON:  Head CT from earlier today FINDINGS: Brain: Small acute infarcts in the bilateral peripheral cerebellum with mild petechial hemorrhage on the left. Mild chronic small vessel ischemic type change in the hemispheric white matter. Brain volume is normal. No hydrocephalus, collection, or masslike finding. Vascular: Left dominant vertebral artery with nonvisualized right vertebral artery flow void at the V4 segment. Skull and upper cervical spine: Normal marrow signal Sinuses/Orbits: Negative IMPRESSION: 1. 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 2. 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 3. Mild chronic white matter disease. Electronically Signed   By: Jorje Guild M.D.   On: 04/20/2021 07:51    EKG: Independently reviewed.  Vent. rate 84 BPM PR interval 164 ms QRS duration 105 ms QT/QTcB 373/441 ms P-R-T axes 76 105 4 Sinus rhythm ST elevation suggests acute pericarditis  Assessment/Plan Principal Problem:   Acute cerebrovascular accident (CVA) ([place]) Admit to telemetry/inpatient. Frequent neurochecks. Swallow screen. Consult PT/OT/SLP. Allow permissive hypertension. Check hemoglobin A1c. Check fasting lipids. Check CTA head and neck. Antiplatelet therapy per neurology. Neuro hospitalist team will be following.  Active Problems:   Essential hypertension Has not been using amlodipine due to leg cramps. Hold antihypertensives at this time. Allow permissive hypertension up to 220/110 mmHg.    Osteoarthritis Has been using kratom at home. Acetaminophen as needed while in the hospital.    Constipation Secondary to Kratom use. Senokot as needed.    DVT prophylaxis: SCDs. Code Status:   Full code. Family Communication:   Disposition Plan:   Patient is from:  Home.  Anticipated DC to:  Home.  Anticipated DC date:  04/22/2021.  Anticipated DC barriers: Clinical status/pending work-up.Marland Kitchen  Consults called:  Neuro hospitalist team is following. Admission status:  Inpatient/telemetry.  Severity of Illness: High severity in the setting of bilateral cerebellar strokes.  Reubin Milan MD [facility]  How to contact the [facility] Attending or Consulting provider [place] or covering provider during after hours [place], for this patient?   Check the care team in [facility] and look for a) attending/consulting [facility] provider listed and b) the [facility] team listed Log into [email or web address] and  use [place]'s universal password to access. If you do not have the password, please contact the hospital operator. Locate the [facility] provider you are looking for under [facility] and page to a number that you can be directly reached. If you still have difficulty reaching the provider, please page the [facility] (Director on Call) for the Hospitalists listed on amion for assistance.  04/20/2021, 9:24 AM   This document was prepared with Dragon voice recognition software and may contain some unintended transcription errors.

## 2021-04-20 NOTE — ED Notes (Signed)
[facility] bedside.  

## 2021-04-20 NOTE — ED Notes (Signed)
[facility] called. 

## 2021-04-20 NOTE — ED Provider Triage Note (Signed)
Emergency Medicine Provider Triage Evaluation Note  Tom Lee , a 56 y.o. male  was evaluated in triage.  Pt complains of dizziness, began 2 days ago.  Noted when he is laying flat in bed.  Has had generalized aching headache however denies any sudden onset thunderclap headache.  No history of CVA, dissection, vertigo.  No pulsatile tinnitus.  Is also noted over the last 48 hours his blood pressure has been higher than normal.  Takes amlodipine daily.  No vision changes, difficulty with word finding, numbness, weakness, neck pain, visual field cuts.  Review of Systems  Positive: Dizziness, hypertension Negative: Numbness, weakness  Physical Exam  BP (!) 192/96 (BP Location: Left Arm)    Pulse 80    Temp 98 F (36.7 C) (Oral)    Resp 18    SpO2 96%  Gen:   Awake, no distress   Resp:  Normal effort  MSK:   Moves extremities without difficulty  Neuro:  Cn 2-12 grossly intact, no neglect Other:    Medical Decision Making  Medically screening exam initiated at 6:19 AM.  Appropriate orders placed.  Darcus Austin was informed that the remainder of the evaluation will be completed by another provider, this initial triage assessment does not replace that evaluation, and the importance of remaining in the ED until their evaluation is complete.  Patient here with nonspecific dizziness, headache and hypertension.  Did review his labs which were obtained from triage nurse earlier today which did not show significant abnormality Placed order for CT head which showed age-indeterminate cerebellar infarct, recommending MRI  I discussed this with patient, wife in room.  Will obtain MRI.   Ch[place]uma Straus A, PA-C 04/20/21 [phone]

## 2021-04-20 NOTE — ED Triage Notes (Signed)
Woke up 2 days dizzy. Unable to regulate normal blood pressure. Takes 5mg  Amlodipine once daily.

## 2021-04-20 NOTE — ED Provider Notes (Signed)
[facility] DEPT Provider Note   CSN: [id number] Arrival date & time: 04/20/21  0258     History  Chief Complaint  Patient presents with   Hypertension    Tom Lee is a 56 y.o. male with history of hypertension who presents to the ED for evaluation of elevated blood pressures averaging at around 190/100-110.  Patient was taking amlodipine 5 mg once daily, however he stopped taking this a little while ago as it was causing some cramping and other side effects.  Yesterday, patient states that he was feeling intermittently dizzy even while sitting down.  As he went to bed last night, he felt like the room was spinning around him while he was lying in bed.  He had trouble walking to the bathroom and back due to dizziness.  He took numerous blood pressures that were significantly elevated.  They did not respond to four amlodipine 5 mg tablets.  He finally decided to come to the ED.  He denies fevers, chills, abdominal pain, N/V/D, weakness, slurred speech, seizures.   Hypertension Pertinent negatives include no abdominal pain, no headaches and no shortness of breath.      Home Medications Prior to Admission medications   Medication Sig Start Date End Date Taking? Authorizing Provider  albuterol (VENTOLIN HFA) 108 (90 BASE) MCG/ACT inhaler Inhale 2 puffs into the lungs every 6 (six) hours as needed. 03/14/12   Colon Branch, MD  Ibuprofen (ADVIL) 200 MG CAPS Take by mouth as needed.    [provider]  losartan (COZAAR) 100 MG tablet Take 1 tablet (100 mg total) by mouth daily. 05/29/12   Colon Branch, MD  metoprolol tartrate (LOPRESSOR) 25 MG tablet Take 1 tablet (25 mg total) by mouth 2 (two) times daily. 07/31/18   Caccavale, Sophia, PA-C  oxyCODONE (ROXICODONE) 5 MG immediate release tablet Take 1 tablet (5 mg total) by mouth every 4 (four) hours as needed for severe pain. 02/14/13   Jovita Kussmaul, MD  promethazine (PHENERGAN) 12.5 MG tablet Take 1-2  tablets (12.5-25 mg total) by mouth every 6 (six) hours as needed for nausea. 02/13/13   Michael Boston, MD      Allergies    Diphenhydramine hcl, Hydrocodone, Diphenhydramine, and Escitalopram oxalate    Review of Systems   Review of Systems  Constitutional:  Negative for fever.  HENT: Negative.    Eyes: Negative.   Respiratory:  Negative for shortness of breath.   Cardiovascular: Negative.   Gastrointestinal:  Negative for abdominal pain and vomiting.  Endocrine: Negative.   Genitourinary: Negative.   Musculoskeletal: Negative.   Skin:  Negative for rash.  Neurological:  Positive for dizziness. Negative for syncope, facial asymmetry, speech difficulty, weakness and headaches.  All other systems reviewed and are negative.  Physical Exam Updated Vital Signs BP (!) 169/101    Pulse 77    Temp 98 F (36.7 C) (Oral)    Resp 17    SpO2 96%  Physical Exam Vitals and nursing note reviewed.  Constitutional:      General: He is not in acute distress.    Appearance: He is not ill-appearing.  HENT:     Head: Atraumatic.  Eyes:     Conjunctiva/sclera: Conjunctivae normal.  Cardiovascular:     Rate and Rhythm: Normal rate and regular rhythm.     Pulses: Normal pulses.     Heart sounds: No murmur heard. Pulmonary:     Effort: Pulmonary effort is normal.  No respiratory distress.     Breath sounds: Normal breath sounds.  Abdominal:     General: Abdomen is flat. There is no distension.     Palpations: Abdomen is soft.     Tenderness: There is no abdominal tenderness.  Musculoskeletal:        General: Normal range of motion.     Cervical back: Normal range of motion.  Skin:    General: Skin is warm and dry.     Capillary Refill: Capillary refill takes less than 2 seconds.  Neurological:     General: No focal deficit present.     Mental Status: He is alert.     Comments: Speech is clear, able to follow commands CN III-XII intact Normal strength in upper and lower extremities  bilaterally including dorsiflexion and plantar flexion, strong and equal grip strength Sensation normal to light and sharp touch Moves extremities without ataxia, coordination intact Normal finger to nose and rapid alternating movements No pronator drift    Psychiatric:        Mood and Affect: Mood normal.    ED Results / Procedures / Treatments   Labs (all labs ordered are listed, but only abnormal results are displayed) Labs Reviewed  CBC  BASIC METABOLIC PANEL    EKG None  Radiology CT HEAD WO CONTRAST (5MM)  Result Date: 04/20/2021 CLINICAL DATA:  Dizziness.  Uncontrolled blood pressure. EXAM: CT HEAD WITHOUT CONTRAST TECHNIQUE: Contiguous axial images were obtained from the base of the skull through the vertex without intravenous contrast. RADIATION DOSE REDUCTION: This exam was performed according to the departmental dose-optimization program which includes automated exposure control, adjustment of the mA and/or kV according to patient size and/or use of iterative reconstruction technique. COMPARISON:  07/19/2011 FINDINGS: Brain: Small low-density in the left paramedian cerebellum which is interval. No hemorrhage, hydrocephalus, or masslike finding. Vascular: No hyperdense vessel or unexpected calcification. Skull: Normal. Negative for fracture or focal lesion. Sinuses/Orbits: No acute finding. IMPRESSION: Age-indeterminate left cerebellar infarct, suggest MR characterization in the setting of dizziness. Electronically Signed   By: Jorje Guild M.D.   On: 04/20/2021 04:21   MR BRAIN WO CONTRAST  Result Date: 04/20/2021 CLINICAL DATA:  Nonspecific dizziness. EXAM: MRI HEAD WITHOUT CONTRAST TECHNIQUE: Multiplanar, multiecho pulse sequences of the brain and surrounding structures were obtained without intravenous contrast. COMPARISON:  Head CT from earlier today FINDINGS: Brain: Small acute infarcts in the bilateral peripheral cerebellum with mild petechial hemorrhage on the left.  Mild chronic small vessel ischemic type change in the hemispheric white matter. Brain volume is normal. No hydrocephalus, collection, or masslike finding. Vascular: Left dominant vertebral artery with nonvisualized right vertebral artery flow void at the V4 segment. Skull and upper cervical spine: Normal marrow signal Sinuses/Orbits: Negative IMPRESSION: 1. Acute infarcts in the bilateral cerebellum with petechial hemorrhage on the left. 2. 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 3. Mild chronic white matter disease. Electronically Signed   By: Jorje Guild M.D.   On: 04/20/2021 07:51    Procedures .Critical Care Performed by: Tonye Pearson, PA-C Authorized by: Tonye Pearson, PA-C   Critical care provider statement:    Critical care time (minutes):  39   Critical care start time:  04/20/2021 7:21 AM   Critical care end time:  04/20/2021 8:00 AM   Critical care was necessary to treat or prevent imminent or life-threatening deterioration  of the following conditions:  CNS failure or compromise   Critical care was time spent personally by me on the following activities:  Development of treatment plan with patient or surrogate, discussions with consultants, examination of patient, ordering and review of laboratory studies, ordering and review of radiographic studies, pulse oximetry, re-evaluation of patient's condition and review of old charts   I assumed direction of critical care for this patient from another provider in my specialty: no     Care discussed with: admitting provider     Medications Ordered in ED Medications - No data to display  ED Course/ Medical Decision Making/ A&P                           Medical Decision Making Amount and/or Complexity of Data Reviewed Labs: ordered. Radiology: ordered. ECG/medicine tests: ordered.  Risk Prescription drug  management. Decision regarding hospitalization.   History:   NIKITAS SEEPERSAD is a 56 y.o. male with history of hypertension who presents to the ED for evaluation of elevated blood pressures averaging at around 190/100-110.  Patient was taking amlodipine 5 mg once daily, however he stopped taking this a little while ago as it was causing some cramping and other side effects.  Yesterday, patient states that he was feeling intermittently dizzy even while sitting down.  As he went to bed last night, he felt like the room was spinning around him while he was lying in bed.  He had trouble walking to the bathroom and back due to dizziness.  He took numerous blood pressures that were significantly elevated.  They did not respond to four amlodipine 5 mg tablets.  He finally decided to come to the ED.  He denies fevers, chills, abdominal pain, N/V/D, weakness, slurred speech, seizures. This patient presents to the ED for concern of dizziness and hypertension, this involves an extensive number of treatment options, and is a complaint that carries with it a high risk of complications and morbidity.   Differentials include hypertensive emergency, dehydration, cerebellar stroke, vertigo  Initial impression:  Patient is well-appearing, sitting upright on the exam chair.  Alert and oriented.  No neurodeficits.  CT and MRI of brain have already returned and confirmed that patient is having acute bilateral cerebellar stroke.  Blood pressure continues to be acutely elevated.  I will place consult to hospitalist for admission.  Lab Tests and EKG:  I Ordered, reviewed, and interpreted labs and EKG.  The pertinent results in my decision-making regarding them are detailed in the ED course and/or initial impression section above.   Imaging Studies ordered:  I ordered imaging studies including CT brain, MRI brain I independently visualized and interpreted imaging and I agree with the radiologist interpretation. Decisions  made regarding results are detailed in the ED course and/or initial impression section above.   Cardiac Monitoring:  The patient was maintained on a cardiac monitor.  I personally viewed and interpreted the cardiac monitored which showed an underlying rhythm of: NSR   Medicines ordered and prescription drug management:  No medications administered   Critical Interventions:  Stroke management as described above   Consultations Obtained:  I requested consultation with neurology and spoke with Dr. Rory Percy who recommended that I admit patient to [facility] and he will see patient.  Spoke with Dr. Olevia Bowens who agrees to admit patient I discussed lab and imaging findings as well as pertinent plan    Reevaluation:  After the interventions  noted above, I reevaluated the patient and found that they have :stayed the same   Disposition:  After consideration of the diagnostic results, physical exam, history and the patients response to treatment feel that the patent would benefit from admission.   Bilateral cerebellar strokes: Patient confirmed on MRI and CT to have bilateral cerebellar strokes.  Neuro exam without focal deficits though pt admits to some ataxia. Patient is to be admitted to [facility] neurology where they will determine ultimate treatment and disposition.   Final Clinical Impression(s) / ED Diagnoses Final diagnoses:  None    Rx / DC Orders ED Discharge Orders     None         Rodena Piety 04/20/21 1858    Godfrey Pick, MD 04/20/21 2119

## 2021-04-21 ENCOUNTER — Encounter: Payer: Self-pay | Admitting: *Deleted

## 2021-04-21 ENCOUNTER — Other Ambulatory Visit: Payer: Self-pay | Admitting: Cardiology

## 2021-04-21 ENCOUNTER — Observation Stay (HOSPITAL_BASED_OUTPATIENT_CLINIC_OR_DEPARTMENT_OTHER): Payer: BC Managed Care – PPO

## 2021-04-21 ENCOUNTER — Observation Stay (HOSPITAL_COMMUNITY): Payer: BC Managed Care – PPO

## 2021-04-21 DIAGNOSIS — I10 Essential (primary) hypertension: Secondary | ICD-10-CM | POA: Diagnosis not present

## 2021-04-21 DIAGNOSIS — I639 Cerebral infarction, unspecified: Secondary | ICD-10-CM

## 2021-04-21 DIAGNOSIS — M79662 Pain in left lower leg: Secondary | ICD-10-CM | POA: Diagnosis not present

## 2021-04-21 DIAGNOSIS — R42 Dizziness and giddiness: Secondary | ICD-10-CM | POA: Diagnosis not present

## 2021-04-21 DIAGNOSIS — E7800 Pure hypercholesterolemia, unspecified: Secondary | ICD-10-CM | POA: Diagnosis not present

## 2021-04-21 LAB — VITAMIN B12: Vitamin B-12: 207 pg/mL (ref 180–914)

## 2021-04-21 LAB — LIPID PANEL
Cholesterol: 177 mg/dL (ref 0–200)
HDL: 35 mg/dL — ABNORMAL LOW (ref >40)
LDL Cholesterol: 84 mg/dL (ref 0–99)
Total CHOL/HDL Ratio: 5.1 RATIO
Triglycerides: 292 mg/dL — ABNORMAL HIGH (ref <150)
VLDL: 58 mg/dL — ABNORMAL HIGH (ref 0–40)

## 2021-04-21 LAB — HEMOGLOBIN A1C
Hgb A1c MFr Bld: 5.7 % — ABNORMAL HIGH (ref 4.8–5.6)
Mean Plasma Glucose: 117 mg/dL

## 2021-04-21 LAB — HIV ANTIBODY (ROUTINE TESTING W REFLEX): HIV Screen 4th Generation wRfx: NONREACTIVE

## 2021-04-21 LAB — TSH: TSH: 2.834 u[IU]/mL (ref 0.350–4.500)

## 2021-04-21 MED ORDER — NAPROXEN 250 MG PO TABS
500.0000 mg | ORAL_TABLET | Freq: Every day | ORAL | Status: DC | PRN
  Filled 2021-04-21: qty 2

## 2021-04-21 MED ORDER — ATORVASTATIN CALCIUM 40 MG PO TABS: 40.0000 mg | ORAL_TABLET | Freq: Every day | ORAL | 1 refills | Status: AC

## 2021-04-21 MED ORDER — LABETALOL HCL 5 MG/ML IV SOLN: 10.0000 mg | INTRAVENOUS | Status: DC | PRN

## 2021-04-21 MED ORDER — CLOPIDOGREL BISULFATE 75 MG PO TABS
75.0000 mg | ORAL_TABLET | Freq: Every day | ORAL | Status: DC
  Administered 2021-04-21: 75 mg via ORAL
  Filled 2021-04-21: qty 1

## 2021-04-21 MED ORDER — NAPROXEN SODIUM 275 MG PO TABS
440.0000 mg | ORAL_TABLET | Freq: Every day | ORAL | Status: DC | PRN
  Administered 2021-04-21: 412.5 mg via ORAL
  Filled 2021-04-21 (×2): qty 2

## 2021-04-21 MED ORDER — ATORVASTATIN CALCIUM 40 MG PO TABS
40.0000 mg | ORAL_TABLET | Freq: Every day | ORAL | Status: DC
  Administered 2021-04-21: 40 mg via ORAL
  Filled 2021-04-21: qty 1

## 2021-04-21 MED ORDER — LISINOPRIL 10 MG PO TABS
10.0000 mg | ORAL_TABLET | Freq: Every day | ORAL | Status: DC
  Administered 2021-04-21: 10 mg via ORAL
  Filled 2021-04-21: qty 1

## 2021-04-21 MED ORDER — ASPIRIN EC 81 MG PO TBEC
81.0000 mg | DELAYED_RELEASE_TABLET | Freq: Every day | ORAL | Status: DC
  Administered 2021-04-21: 81 mg via ORAL
  Filled 2021-04-21: qty 1

## 2021-04-21 MED ORDER — ASPIRIN 81 MG PO TBEC: 81.0000 mg | DELAYED_RELEASE_TABLET | Freq: Every day | ORAL | 1 refills | Status: AC

## 2021-04-21 MED ORDER — BLOOD PRESSURE KIT: 1.0000 | PACK | Freq: Two times a day (BID) | 0 refills | Status: AC

## 2021-04-21 MED ORDER — CLOPIDOGREL BISULFATE 75 MG PO TABS: 75.0000 mg | ORAL_TABLET | Freq: Every day | ORAL | 0 refills | Status: DC

## 2021-04-21 MED ORDER — LISINOPRIL 10 MG PO TABS: 10.0000 mg | ORAL_TABLET | Freq: Every day | ORAL | 1 refills | Status: DC

## 2021-04-21 NOTE — Evaluation (Signed)
Speech Language Pathology Evaluation Patient Details Name: Tom Lee MRN: [id number] DOB: [birth year] Today's Date: 04/21/2021 Time: [id number] SLP Time Calculation (min) (ACUTE ONLY): 30 min  Problem List:  Patient Active Problem List   Diagnosis Date Noted   Acute cerebrovascular accident (CVA) ([place]) 04/20/2021   Osteoarthritis 04/20/2021   Constipation 04/20/2021   Acute CVA (cerebrovascular accident) ([place]) 04/20/2021   Paresthesias 10/28/2012   Skin lesion 10/28/2012   General medical examination 08/04/2011   Anxiety 07/03/2011   Headache(784.0) 07/03/2011   BACK PAIN 12/31/2009   CHEST PAIN 12/14/2009   Essential hypertension 11/24/2009   Past Medical History:  Past Medical History:  Diagnosis Date   Arthritis    Asthma    dx as a child, no symptoms x years   Hypertension 2011   Past Surgical History:  Past Surgical History:  Procedure Laterality Date   broken ankle  2008   had surgery   HERNIA REPAIR  10/2009   HPI:      Assessment / Plan / Recommendation Clinical Impression  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 Patient scored 90/120, indicating a mild cognitive deficit. Pt demonstrated increased difficulty with recall of new information; benefited from category cues and multiple choice cues. Pt also demonstrated difficulting copying cube drawing; possibly secondary to pt reported dizziness. Noted a mild deficit with category switching; pt named fruits instead of vegetables. Despite noted deficits, pt reports he feels that he is back to baseline cognitive status. SLP educated patient on integrating memory aids or receiving outpatient speech services in the future if needed. Pt verbalized he feels comfortable having asssistance from spouse at home to complete financial tasks and will not have deficits performing ADLs at home or work.    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  Recommendations may be updated based on patient status, additional functional criteria and insurance authorization.    Follow Up Recommendations  No SLP follow up    Assistance Recommended at Discharge  None  Functional Status Assessment Patient has had a recent decline in their functional status and demonstrates the ability to make significant improvements in function in a reasonable and predictable amount of time.  Frequency and Duration           SLP Evaluation Cognition  Overall Cognitive Status: Within Functional Limits for tasks assessed Arousal/Alertness: Awake/alert Orientation Level: Oriented to person;Oriented to time;Oriented to situation Attention: Focused Focused Attention: Appears intact Memory: Appears intact Awareness: Appears intact Problem Solving: Appears intact Safety/Judgment: Appears intact       Comprehension  Auditory Comprehension Overall Auditory Comprehension: Appears within functional limits for tasks assessed Yes/No Questions: Within Functional Limits Commands: Within Functional Limits Conversation: Complex Interfering Components: Other (comment) (Dizziness) EffectiveTechniques: Extra processing time;Repetition Visual Recognition/Discrimination Discrimination: Not tested Reading Comprehension Reading Status: Not tested    Expression Expression Primary Mode of Expression: Verbal Verbal Expression Overall Verbal Expression: Appears within functional limits for tasks assessed Initiation: No impairment Automatic Speech: Name;Social Response Level of Generative/Spontaneous Verbalization: Conversation Repetition: No impairment Naming: Not tested Pragmatics: No impairment Effective Techniques:  Semantic cues Non-Verbal Means of Communication: Gestures Written Expression Written Expression: Not tested   Oral / Motor  Oral Motor/Sensory Function Overall Oral Motor/Sensory Function: Within functional limits Motor  Speech Overall Motor Speech: Appears within functional limits for tasks assessed Respiration: Within functional limits Phonation: Normal Resonance: Within functional limits Articulation: Within functional limitis Intelligibility: Intelligible Motor Planning: Witnin functional limits Motor Speech Errors: Not applicable            [employer], Student-SLP

## 2021-04-21 NOTE — Progress Notes (Signed)
TCD bubble study has been completed.   Preliminary results in CV Proc.   Tom Lee 04/21/2021 2:02 PM

## 2021-04-21 NOTE — Progress Notes (Addendum)
[facility] asked to place cardiac monitor in the setting of acute CVA. Dr. Duke Salvia to read. Office follow up arranged.

## 2021-04-21 NOTE — Progress Notes (Addendum)
STROKE TEAM PROGRESS NOTE   INTERVAL HISTORY No family is at the bedside.  Patient seen and assessed at bedside. Patient reports he had fluctuating dizziness and elevated blood pressure which led to him coming into the ED. Patient reports he still has episodic dizziness but is back to baseline. Patient denies having headaches. Discussed plan for 30 day cardiac monitoring followed by TEE/loop recorder if 30 day monitor does not show any abnormal rhythms. Also, discussed we would contact him for hypercoagulable lab results once discharged if any are abnormal.   Vitals:   04/21/21 0100 04/21/21 0341 04/21/21 0914 04/21/21 1205  BP: (!) 169/105 (!) 166/103 (!) 166/100 (!) 152/100  Pulse:  87 81 87  Resp:  17 20 20   Temp:  98.7 F (37.1 C) 98.8 F (37.1 C) 98.7 F (37.1 C)  TempSrc:  Oral Oral Oral  SpO2:  94% 94% 97%  Weight:      Height:       CBC:  Recent Labs  Lab 04/20/21 0312  WBC 4.2  HGB 14.2  HCT 43.1  MCV 83.7  PLT 0000000   Basic Metabolic Panel:  Recent Labs  Lab 04/20/21 0312  NA 138  K 3.7  CL 105  CO2 25  GLUCOSE 97  BUN 13  CREATININE 0.80  CALCIUM 9.1  MG 2.0   Lipid Panel:  Recent Labs  Lab 04/21/21 0436  CHOL 177  TRIG 292*  HDL 35*  CHOLHDL 5.1  VLDL 58*  LDLCALC 84   HgbA1c: No results for input(s): HGBA1C in the last 168 hours. Urine Drug Screen: No results for input(s): LABOPIA, COCAINSCRNUR, LABBENZ, AMPHETMU, THCU, LABBARB in the last 168 hours.  Alcohol Level No results for input(s): ETH in the last 168 hours.  IMAGING past 24 hours CT HEAD WO CONTRAST (5MM)  Result Date: 04/21/2021 CLINICAL DATA:  Dizziness. EXAM: CT HEAD WITHOUT CONTRAST TECHNIQUE: Contiguous axial images were obtained from the base of the skull through the vertex without intravenous contrast. RADIATION DOSE REDUCTION: This exam was performed according to the departmental dose-optimization program which includes automated exposure control, adjustment of the mA and/or kV  according to patient size and/or use of iterative reconstruction technique. COMPARISON:  April 20, 2021. FINDINGS: Brain: Stable bilateral cerebellar low densities are noted, left greater than right, consistent with acute infarctions as noted on recent MRI. No definite hemorrhage is noted currently. No mass effect or midline shift is noted. Ventricular size is within normal limits. Vascular: No hyperdense vessel or unexpected calcification. Skull: Normal. Negative for fracture or focal lesion. Sinuses/Orbits: No acute finding. Other: None. IMPRESSION: Stable bilateral cerebellar low densities are noted, left greater than right, consistent with acute infarctions as noted on recent MRI. Electronically Signed   By: Marijo Conception M.D.   On: 04/21/2021 08:46   ECHOCARDIOGRAM COMPLETE  Result Date: 04/20/2021    ECHOCARDIOGRAM REPORT   Patient Name:   Tom Lee Date of Exam: 04/20/2021 Medical Rec #:  [id number]     Height:       71.0 in Accession #:    [id number]    Weight:       215.0 lb Date of Birth:  Nov 28, 1965     BSA:          2.174 m Patient Age:    56 years      BP:           155/104 mmHg Patient Gender: M  HR:           73 bpm. Exam Location:  Inpatient Procedure: 2D Echo, Cardiac Doppler and Color Doppler Indications:    Stroke  History:        Patient has no prior history of Echocardiogram examinations.                 Stroke, Signs/Symptoms:Chest Pain; Risk Factors:Hypertension and                 Former Smoker.  Sonographer:    Glo Herring Referring Phys: [id number] [place]  1. Left ventricular ejection fraction, by estimation, is 55 to 60%. The left ventricle has normal function. The left ventricle has no regional wall motion abnormalities. There is mild left ventricular hypertrophy. Left ventricular diastolic parameters are consistent with Grade II diastolic dysfunction (pseudonormalization).  2. Right ventricular systolic function is normal. The right  ventricular size is normal. Tricuspid regurgitation signal is inadequate for assessing PA pressure.  3. The mitral valve is normal in structure. No evidence of mitral valve regurgitation. No evidence of mitral stenosis.  4. The aortic valve is tricuspid. Aortic valve regurgitation is not visualized. No aortic stenosis is present.  5. The inferior vena cava is normal in size with greater than 50% respiratory variability, suggesting right atrial pressure of 3 mmHg. FINDINGS  Left Ventricle: Left ventricular ejection fraction, by estimation, is 55 to 60%. The left ventricle has normal function. The left ventricle has no regional wall motion abnormalities. The left ventricular internal cavity size was normal in size. There is  mild left ventricular hypertrophy. Left ventricular diastolic parameters are consistent with Grade II diastolic dysfunction (pseudonormalization). Right Ventricle: The right ventricular size is normal. No increase in right ventricular wall thickness. Right ventricular systolic function is normal. Tricuspid regurgitation signal is inadequate for assessing PA pressure. Left Atrium: Left atrial size was normal in size. Right Atrium: Right atrial size was normal in size. Pericardium: There is no evidence of pericardial effusion. Mitral Valve: The mitral valve is normal in structure. No evidence of mitral valve regurgitation. No evidence of mitral valve stenosis. Tricuspid Valve: The tricuspid valve is normal in structure. Tricuspid valve regurgitation is not demonstrated. Aortic Valve: The aortic valve is tricuspid. Aortic valve regurgitation is not visualized. No aortic stenosis is present. Aortic valve mean gradient measures 3.0 mmHg. Aortic valve peak gradient measures 5.2 mmHg. Aortic valve area, by VTI measures 2.24 cm. Pulmonic Valve: The pulmonic valve was normal in structure. Pulmonic valve regurgitation is not visualized. Aorta: The aortic root is normal in size and structure. Venous: The  inferior vena cava is normal in size with greater than 50% respiratory variability, suggesting right atrial pressure of 3 mmHg. IAS/Shunts: No atrial level shunt detected by color flow Doppler.  LEFT VENTRICLE PLAX 2D LVIDd:         4.30 cm   Diastology LVIDs:         3.00 cm   LV e' medial:    7.62 cm/s LV PW:         1.30 cm   LV E/e' medial:  11.3 LV IVS:        1.30 cm   LV e' lateral:   11.90 cm/s LVOT diam:     1.90 cm   LV E/e' lateral: 7.2 LV SV:         49 LV SV Index:   23 LVOT Area:     2.84 cm  RIGHT VENTRICLE  IVC RV S prime:     11.00 cm/s  IVC diam: 1.80 cm LEFT ATRIUM             Index LA diam:        3.80 cm 1.75 cm/m LA Vol (A2C):   42.0 ml 19.32 ml/m LA Vol (A4C):   35.0 ml 16.10 ml/m LA Biplane Vol: 39.9 ml 18.35 ml/m  AORTIC VALVE                    PULMONIC VALVE AV Area (Vmax):    2.22 cm     PV Vmax:       1.11 m/s AV Area (Vmean):   2.10 cm     PV Peak grad:  4.9 mmHg AV Area (VTI):     2.24 cm AV Vmax:           114.00 cm/s AV Vmean:          77.300 cm/s AV VTI:            0.219 m AV Peak Grad:      5.2 mmHg AV Mean Grad:      3.0 mmHg LVOT Vmax:         89.40 cm/s LVOT Vmean:        57.300 cm/s LVOT VTI:          0.173 m LVOT/AV VTI ratio: 0.79  AORTA Ao Root diam: 3.00 cm Ao Asc diam:  2.90 cm MITRAL VALVE MV Area (PHT): 3.33 cm    SHUNTS MV Decel Time: 228 msec    Systemic VTI:  0.17 m MV E velocity: 86.10 cm/s  Systemic Diam: 1.90 cm MV A velocity: 57.80 cm/s MV E/A ratio:  1.49 Dalton McleanMD Electronically signed by Franki Monte Signature Date/Time: 04/20/2021/6:16:22 PM    Final    VAS [place] LOWER EXTREMITY VENOUS (DVT)  Result Date: 04/21/2021  Lower Venous DVT Study Patient Name:  Tom Lee  Date of Exam:   04/21/2021 Medical Rec #: [id number]      Accession #:    [id number] Date of Birth: 11/27/1965      Patient Gender: M Patient Age:   20 years Exam Location:  [facility] Procedure:      VAS [place] LOWER EXTREMITY VENOUS (DVT) Referring Phys: Cornelius Moras  Kani Chauvin --------------------------------------------------------------------------------  Indications: Stroke, and left calf pain and tenderness x months.  Comparison Study: No prior study Performing Technologist: Maudry Mayhew MHA, RDMS, RVT, RDCS  Examination Guidelines: A complete evaluation includes B-mode imaging, spectral Doppler, color Doppler, and power Doppler as needed of all accessible portions of each vessel. Bilateral testing is considered an integral part of a complete examination. Limited examinations for reoccurring indications may be performed as noted. The reflux portion of the exam is performed with the patient in reverse Trendelenburg.  +---------+---------------+---------+-----------+----------+--------------+  RIGHT     Compressibility Phasicity Spontaneity Properties Thrombus Aging  +---------+---------------+---------+-----------+----------+--------------+  CFV       Full            Yes       Yes                                    +---------+---------------+---------+-----------+----------+--------------+  SFJ       Full                                                             +---------+---------------+---------+-----------+----------+--------------+  FV Prox   Full                                                             +---------+---------------+---------+-----------+----------+--------------+  FV Mid    Full                                                             +---------+---------------+---------+-----------+----------+--------------+  FV Distal Full                                                             +---------+---------------+---------+-----------+----------+--------------+  PFV       Full                                                             +---------+---------------+---------+-----------+----------+--------------+  POP       Full            Yes       Yes                                     +---------+---------------+---------+-----------+----------+--------------+  PTV       Full                                                             +---------+---------------+---------+-----------+----------+--------------+  PERO      Full                                                             +---------+---------------+---------+-----------+----------+--------------+   +---------+---------------+---------+-----------+----------+--------------+  LEFT      Compressibility Phasicity Spontaneity Properties Thrombus Aging  +---------+---------------+---------+-----------+----------+--------------+  CFV       Full            Yes       Yes                                    +---------+---------------+---------+-----------+----------+--------------+  SFJ       Full                                                             +---------+---------------+---------+-----------+----------+--------------+  FV Prox   Full                                                             +---------+---------------+---------+-----------+----------+--------------+  FV Mid    Full                                                             +---------+---------------+---------+-----------+----------+--------------+  FV Distal Full                                                             +---------+---------------+---------+-----------+----------+--------------+  PFV       Full                                                             +---------+---------------+---------+-----------+----------+--------------+  POP       Full            Yes       Yes                                    +---------+---------------+---------+-----------+----------+--------------+  PTV       Full                                                             +---------+---------------+---------+-----------+----------+--------------+  PERO      Full                                                              +---------+---------------+---------+-----------+----------+--------------+    Summary: RIGHT: - There is no evidence of deep vein thrombosis in the lower extremity.  - No cystic structure found in the popliteal fossa.  LEFT: - There is no evidence of deep vein thrombosis in the lower extremity.  - No cystic structure found in the popliteal fossa.  Patent varicose veins noted in the left calf in the area of pain.  *See table(s) above for measurements and observations.    Preliminary     PHYSICAL EXAM  Temp:  [98.4 F (36.9 C)-98.8 F (37.1 C)] 98.7 F (37.1 C) (01/26 1205) Pulse Rate:  [81-87] 87 (01/26 1205) Resp:  [16-20] 20 (01/26 1205) BP: (152-181)/(96-106) 152/100 (01/26 1205) SpO2:  [94 %-100 %] 97 % (01/26  1205)  General - Well nourished, well developed, in no apparent distress.  Cardiovascular - Regular rhythm and rate.  Mental Status -  Level of arousal and orientation to time, place, and person were intact. Language including expression, naming, repetition, comprehension was assessed and found intact. Attention span and concentration were normal. Recent and remote memory were intact. Fund of Knowledge was assessed and was intact.  Cranial Nerves II - XII - II - Visual field intact OU. III, IV, VI - Extraocular movements intact. V - Facial sensation intact bilaterally. VII - Facial movement intact bilaterally. VIII - Hearing & vestibular intact bilaterally. X - Palate elevates symmetrically. XI - Chin turning & shoulder shrug intact bilaterally. XII - Tongue protrusion intact.  Motor Strength - The patients strength was normal in all extremities and pronator drift was absent.  Bulk was normal and fasciculations were absent.   Motor Tone - Muscle tone was assessed at the neck and appendages and was normal.  Reflexes - The patients reflexes were symmetrical in all extremities and he had no pathological reflexes.  Sensory - Light touch, temperature/pinprick were  assessed and were symmetrical.    Coordination - The patient had normal movements in the hands and feet with no ataxia or dysmetria.  Tremor was absent.  Gait and Station - deferred.  ASSESSMENT/PLAN Tom Lee is a 56 y.o. male with history of osteoarthritis, childhood asthma, SVT in May 2020, HTN (medication non-compliant) presenting with dizziness.   Stroke: bilateral cerebellar PICA infarcts, etiology unclear, concerning for cardioembolic source [id number] CT head Age-indeterminate left cerebellar infarct  1/26 Repeat CT shows stable bilateral cerebellar infarct CTA head & neck Patent vasculature of the head and neck with no significant stenosis, occlusion, or dissection. 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 Diminutive right vertebral artery with a dominant left vertebral artery, a normal variant. MRI  Acute infarcts in the bilateral cerebellum with petechial hemorrhage on the left. 2D Echo: EF 55-60% B/L DVT U/S: no evidence of DVT TCD bubble study no PFO Recommended 30-day cardiac event monitor as outpatient to rule out A. fib.  Also recommend outpatient TEE.  If 30-day monitoring no A. fib, consider loop recorder as outpatient. LDL 84 HgbA1c pending Hypercoagulable work-up pending VTE prophylaxis - SCDs No antithrombotic prior to admission, now on aspirin 81 mg daily and clopidogrel 75 mg daily x3 weeks and then ASA alone Therapy recommendations:  none Disposition:  pending  History of SVT 07/2018 patient had ED visit for heart palpitation Heart rate up to 200s, EMS noted in SVT Successfully converted with adenosine prior to arrival to ED Recommended cardiac follow-up but never done No specific treatment since.  Hypertension Home meds:  norvasc Stable at high end Resume home meds Close PCP follow-up for better BP control Long-term BP goal normotensive  Hyperlipidemia Home meds:  none LDL 84, goal < 70 Start Lipitor 40 this admission  Continue statin at discharge  Other  Stroke Risk Factors Former Cigarette smoker  Other Active Problems Osteoarthritis Constipation  Hospital day # 1  France Ravens, MD PGY1 Resident  ATTENDING NOTE: I reviewed above note and agree with the assessment and plan. Pt was seen and examined.   56 year old male with history of SVT in 07/2018, hypertension, obesity admitted for dizziness, vertigo and hypertension.  CT showed left cerebellar infarct.  MRI showed bilateral PICA infarcts.  CTA head and neck unremarkable.  LE venous Doppler no DVT.  TCD bubble study no PFO.  EF 55 to 60%.  Hypercoagulable work-up pending.  LDL 84, A1c pending.  Creatinine 0.80, BP 160s.  On exam, patient neurologically intact, no nystagmus, no ataxia or diplopia.  Etiology for patient stroke unclear, concerning for cardioembolic source given bilateral PICA infarcts.  He does have SVT in 07/2018, did not follow-up with cardiology later.  Recommend 30-day CardioNet monitor and TEE as outpatient.  If 30-day cardiac event monitor no A. fib, will consider loop recorder.  Continue aspirin 81 and Plavix 75 DAPT for 3 weeks and then aspirin alone.  Continue Lipitor 40.  PT/OT no recommendation.  We will follow-up at [facility].  For detailed assessment and plan, please refer to above as I have made changes wherever appropriate.   Neurology will sign off. Please call with questions. Pt will follow up with stroke clinic NP at [facility] in about 4 weeks. Thanks for the consult.   Rosalin Hawking, MD PhD Stroke Neurology 04/21/2021 5:06 PM   To contact Stroke Continuity provider, please refer to [email or web address] After hours, contact General Neurology

## 2021-04-21 NOTE — Progress Notes (Signed)
Bilateral lower extremity venous duplex completed. Refer to "CV Proc" under chart review to view preliminary results.  04/21/2021 9:03 AM Eula Fried., MHA, RVT, RDCS, RDMS

## 2021-04-21 NOTE — Discharge Summary (Signed)
Physician Discharge Summary  Tom Lee JXB:[id number] DOB: 10/21/1965 DOA: 04/20/2021  PCP: Kristen Loader, FNP  Admit date: 04/20/2021 Discharge date: 04/21/2021  Admitted From: Home Disposition:  Home  Discharge Condition:Stable CODE STATUS:FULL Diet recommendation: Heart Healthy   Brief/Interim Summary:  Tom Lee is a 56 y.o. male with medical history significant of osteoarthritis, mild childhood asthma, hypertension who had not been taking his antihypertensives in the past 2 months due to bilateral lower extremity cramps who is coming to the emergency department complaints of 2 days of on and off dizziness .  On presentation he was hypertensive.  CT head without contrast showed age-indeterminate left cerebellar infarct.  MRI of the brain was without contrast showed acute infarcts in bilateral cerebellum with petechial hemorrhage on the left.  He was admitted for stroke work-up.  Neurology was consulted.  Stroke work-up completed.  PT/OT did not recommend any follow-up.  He is medically stable for discharge to home today.  He will follow-up with neurology as an outpatient.  Following problems were addressed during his hospitalization:  Acute CVA: Stroke work-up completed.  MRI of the brain showed acute infarcts in bilateral cerebellum with petechial hemorrhage on the left.  Follow-up CT scan did not show any new changes.  CTA head and neck did not show any large vessel occlusion.  Echocardiogram showed EF of 55 to 13%, grade 2 diastolic dysfunction, no shunt.  TCD bubble study did not show shunt. Neurology neurology recommending aspirin and Plavix for 3 weeks followed by aspirin alone.  Started on Lipitor. Hemoglobin A1c is still pending.  This should be followed by his PCP. Neurology recommend outpatient follow-up in 4 weeks.  PT/OT did not recommend any follow-up.  Hypertension: Blood pressure on the higher side.  Started on lisinopril 10 mg daily.  He should monitor his blood  pressure at home and he should follow-up with his PCP in a week and titrate his medications if needed.     Discharge Diagnoses:  Principal Problem:   Acute cerebrovascular accident (CVA) [facility]) Active Problems:   Essential hypertension   Osteoarthritis   Constipation   Acute CVA (cerebrovascular accident) [facility])    Discharge Instructions  Discharge Instructions     Ambulatory referral to Neurology   Complete by: As directed    Follow up with stroke clinic NP (Jessica Vanschaick or Cecille Rubin, if both not available, consider Zachery Dauer, or Ahern) at [facility] in about 4 weeks. Thanks.   Diet - low sodium heart healthy   Complete by: As directed    Discharge instructions   Complete by: As directed    1)Please take prescribed medications as instructed 2)Follow up with [facility] neurology in 4 weeks.  Name and number the provider group has been attached 3)Follow up with your PCP in a week.  Monitor blood pressure at home.   Increase activity slowly   Complete by: As directed       Allergies as of 04/21/2021       Reactions   Diphenhydramine Hcl    REACTION: increased swelling and hives   Hydrocodone Nausea Only   Diphenhydramine Other (See Comments)   Escitalopram Oxalate         Medication List     STOP taking these medications    amLODipine 5 MG tablet Commonly known as: NORVASC       TAKE these medications    albuterol 108 (90 Base) MCG/ACT inhaler Commonly known as: Ventolin HFA Inhale 2 puffs into  the lungs every 6 (six) hours as needed. What changed: reasons to take this   Alka-Seltzer Heartburn 750 MG chewable tablet Generic drug: calcium carbonate Chew 2 tablets by mouth every 6 (six) hours as needed for heartburn.   aspirin 81 MG EC tablet Take 1 tablet (81 mg total) by mouth daily. Swallow whole. Start taking on: April 22, 2021   atorvastatin 40 MG tablet Commonly known as: LIPITOR Take 1 tablet (40 mg total) by mouth daily. Start  taking on: April 22, 2021   Blood Pressure Kit 1 each by Does not apply route 2 times daily at 12 noon and 4 pm.   clopidogrel 75 MG tablet Commonly known as: PLAVIX Take 1 tablet (75 mg total) by mouth daily. Start taking on: April 22, 2021   Dulcolax Milk of Magnesia 400 MG/5ML suspension Generic drug: magnesium hydroxide Take 30 mLs by mouth daily as needed for mild constipation.   Ibuprofen 200 MG Caps Take 200 mg by mouth daily as needed (pain).   lisinopril 10 MG tablet Commonly known as: ZESTRIL Take 1 tablet (10 mg total) by mouth daily. Start taking on: April 22, 2021   naproxen sodium 220 MG tablet Commonly known as: ALEVE Take 440 mg by mouth daily as needed (pain).        Follow-up Information     [facility]. Schedule an appointment as soon as possible for a visit in 1 month(s).   Specialty: Neurology Why: stroke clinic Contact information: [address] [place], [place], [place]. Schedule an appointment as soon as possible for a visit in 1 week(s).   Specialty: Family Medicine Contact information: [place] [address] [phone]                Allergies  Allergen Reactions   Diphenhydramine Hcl     REACTION: increased swelling and hives   Hydrocodone Nausea Only   Diphenhydramine Other (See Comments)   Escitalopram Oxalate     Consultations: Neurology   Procedures/Studies: CT Angio Head W or Wo Contrast  Result Date: 04/20/2021 CLINICAL DATA:  Cerebellar infarcts EXAM: CT ANGIOGRAPHY HEAD AND NECK TECHNIQUE: Multidetector CT imaging of the head and neck was performed using the standard protocol during bolus administration of intravenous contrast. Multiplanar CT image reconstructions and MIPs were obtained to evaluate the vascular anatomy. Carotid stenosis measurements (when applicable) are obtained utilizing NASCET criteria,  using the distal internal carotid diameter as the denominator. RADIATION DOSE REDUCTION: This exam was performed according to the departmental dose-optimization program which includes automated exposure control, adjustment of the mA and/or kV according to patient size and/or use of iterative reconstruction technique. CONTRAST:  43mL OMNIPAQUE IOHEXOL 350 MG/ML SOLN COMPARISON:  Same-day CT head and brain MRI FINDINGS: CTA NECK FINDINGS Aortic arch: The imaged aortic arch is unremarkable. The origins of the major branch vessels are normal. The subclavian arteries are patent. Right carotid system: The right common, internal, and external carotid arteries are patent, without hemodynamically significant stenosis or occlusion. There is no dissection or aneurysm. Left carotid system: The left common, internal, and external carotid arteries are patent, without hemodynamically significant stenosis or occlusion. There is no dissection or aneurysm. Vertebral arteries: The right vertebral artery is markedly diminutive with a dominant left vertebral artery, a normal variant. Both vertebral arteries are patent, without hemodynamically significant stenosis or occlusion. There is no dissection or aneurysm. Skeleton:  There is mild multilevel degenerative change of the cervical spine, most advanced at C4-C5. A sclerotic lesion in the C7 vertebral body is unchanged since 2011, likely a bone island. There is no visible canal hematoma. Other neck: The soft tissues are unremarkable. There is periapical lucency around the root of the left maxillary lateral incisor and posterior most left maxillary molar. There is a prominent carious lesion of the left mandibular canine. Upper chest: The imaged lung apices are clear. Review of the MIP images confirms the above findings CTA HEAD FINDINGS Anterior circulation: The intracranial ICAs are widely patent. The bilateral MCAs are patent. The bilateral ACAs are patent. The anterior communicating  artery is normal. There is no aneurysm or AVM. Posterior circulation: The right V4 segment is diminutive, likely congenitally. The V 4 segments are both patent throughout. 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 The basilar artery is patent. The superior cerebellar arteries are identified bilaterally. The PCAs are patent. Both posterior communicating arteries are identified. There is no aneurysm or AVM. Venous sinuses: Patent. Anatomic variants: As above. Review of the MIP images confirms the above findings IMPRESSION: 1. Patent vasculature of the head and neck with no significant stenosis, occlusion, or dissection. 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 2. Diminutive right vertebral artery with a dominant left vertebral artery, a normal variant. 3. Dental disease as above. Electronically Signed   By: Valetta Mole M.D.   On: 04/20/2021 10:21   CT HEAD WO CONTRAST (5MM)  Result Date: 04/21/2021 CLINICAL DATA:  Dizziness. EXAM: CT HEAD WITHOUT CONTRAST TECHNIQUE: Contiguous axial images were obtained from the base of the skull through the vertex without intravenous contrast. RADIATION DOSE REDUCTION: This exam was performed according to the departmental dose-optimization program which includes automated exposure control, adjustment of the mA and/or kV according to patient size and/or use of iterative reconstruction technique. COMPARISON:  April 20, 2021. FINDINGS: Brain: Stable bilateral cerebellar low densities are noted, left greater than right, consistent with acute infarctions as noted on recent MRI. No definite hemorrhage is noted currently. No mass effect or midline shift is noted. Ventricular size is within normal limits. Vascular: No hyperdense vessel or unexpected calcification. Skull: Normal. Negative for fracture or focal lesion. Sinuses/Orbits: No acute finding. Other: None. IMPRESSION: Stable bilateral cerebellar low densities are noted, left greater than right, consistent with acute infarctions as noted on recent  MRI. Electronically Signed   By: Marijo Conception M.D.   On: 04/21/2021 08:46   CT HEAD WO CONTRAST (5MM)  Result Date: 04/20/2021 CLINICAL DATA:  Dizziness.  Uncontrolled blood pressure. EXAM: CT HEAD WITHOUT CONTRAST TECHNIQUE: Contiguous axial images were obtained from the base of the skull through the vertex without intravenous contrast. RADIATION DOSE REDUCTION: This exam was performed according to the departmental dose-optimization program which includes automated exposure control, adjustment of the mA and/or kV according to patient size and/or use of iterative reconstruction technique. COMPARISON:  07/19/2011 FINDINGS: Brain: Small low-density in the left paramedian cerebellum which is interval. No hemorrhage, hydrocephalus, or masslike finding. Vascular: No hyperdense vessel or unexpected calcification. Skull: Normal. Negative for fracture or focal lesion. Sinuses/Orbits: No acute finding. IMPRESSION: Age-indeterminate left cerebellar infarct, suggest MR characterization in the setting of dizziness. Electronically Signed   By: Jorje Guild M.D.   On: 04/20/2021 04:21   CT Angio Neck W and/or Wo Contrast  Result Date: 04/20/2021 CLINICAL DATA:  Cerebellar infarcts EXAM: CT ANGIOGRAPHY HEAD AND NECK TECHNIQUE: Multidetector CT imaging of the head and neck was performed using the  standard protocol during bolus administration of intravenous contrast. Multiplanar CT image reconstructions and MIPs were obtained to evaluate the vascular anatomy. Carotid stenosis measurements (when applicable) are obtained utilizing NASCET criteria, using the distal internal carotid diameter as the denominator. RADIATION DOSE REDUCTION: This exam was performed according to the departmental dose-optimization program which includes automated exposure control, adjustment of the mA and/or kV according to patient size and/or use of iterative reconstruction technique. CONTRAST:  28mL OMNIPAQUE IOHEXOL 350 MG/ML SOLN COMPARISON:   Same-day CT head and brain MRI FINDINGS: CTA NECK FINDINGS Aortic arch: The imaged aortic arch is unremarkable. The origins of the major branch vessels are normal. The subclavian arteries are patent. Right carotid system: The right common, internal, and external carotid arteries are patent, without hemodynamically significant stenosis or occlusion. There is no dissection or aneurysm. Left carotid system: The left common, internal, and external carotid arteries are patent, without hemodynamically significant stenosis or occlusion. There is no dissection or aneurysm. Vertebral arteries: The right vertebral artery is markedly diminutive with a dominant left vertebral artery, a normal variant. Both vertebral arteries are patent, without hemodynamically significant stenosis or occlusion. There is no dissection or aneurysm. Skeleton: There is mild multilevel degenerative change of the cervical spine, most advanced at C4-C5. A sclerotic lesion in the C7 vertebral body is unchanged since 2011, likely a bone island. There is no visible canal hematoma. Other neck: The soft tissues are unremarkable. There is periapical lucency around the root of the left maxillary lateral incisor and posterior most left maxillary molar. There is a prominent carious lesion of the left mandibular canine. Upper chest: The imaged lung apices are clear. Review of the MIP images confirms the above findings CTA HEAD FINDINGS Anterior circulation: The intracranial ICAs are widely patent. The bilateral MCAs are patent. The bilateral ACAs are patent. The anterior communicating artery is normal. There is no aneurysm or AVM. Posterior circulation: The right V4 segment is diminutive, likely congenitally. The V 4 segments are both patent throughout. 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 The basilar artery is patent. The superior cerebellar arteries are identified bilaterally. The PCAs are patent. Both posterior communicating arteries are identified. There is no  aneurysm or AVM. Venous sinuses: Patent. Anatomic variants: As above. Review of the MIP images confirms the above findings IMPRESSION: 1. Patent vasculature of the head and neck with no significant stenosis, occlusion, or dissection. 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 2. Diminutive right vertebral artery with a dominant left vertebral artery, a normal variant. 3. Dental disease as above. Electronically Signed   By: Valetta Mole M.D.   On: 04/20/2021 10:21   MR BRAIN WO CONTRAST  Result Date: 04/20/2021 CLINICAL DATA:  Nonspecific dizziness. EXAM: MRI HEAD WITHOUT CONTRAST TECHNIQUE: Multiplanar, multiecho pulse sequences of the brain and surrounding structures were obtained without intravenous contrast. COMPARISON:  Head CT from earlier today FINDINGS: Brain: Small acute infarcts in the bilateral peripheral cerebellum with mild petechial hemorrhage on the left. Mild chronic small vessel ischemic type change in the hemispheric white matter. Brain volume is normal. No hydrocephalus, collection, or masslike finding. Vascular: Left dominant vertebral artery with nonvisualized right vertebral artery flow void at the V4 segment. Skull and upper cervical spine: Normal marrow signal Sinuses/Orbits: Negative IMPRESSION: 1. Acute infarcts in the bilateral cerebellum with petechial hemorrhage on the left. 2. 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 3. Mild chronic white matter disease. Electronically  Signed   By: Jorje Guild M.D.   On: 04/20/2021 07:51   VAS [place] TRANSCRANIAL DOPPLER W BUBBLES  Result Date: 04/21/2021  Transcranial Doppler with Bubble Patient Name:  Tom Lee  Date of Exam:   04/21/2021 Medical Rec #: [id number]      Accession #:    [id number] Date of Birth: 03-Nov-1965      Patient Gender: M Patient Age:   68 years Exam Location[facility]  [facility] Procedure:      VAS [place] TRANSCRANIAL  DOPPLER W BUBBLES Referring Phys: Kennadi Albany --------------------------------------------------------------------------------  Indications: Stroke. Comparison Study: no prior Performing Technologist: Archie Patten RVS  Examination Guidelines: A complete evaluation includes B-mode imaging, spectral Doppler, color Doppler, and power Doppler as needed of all accessible portions of each vessel. Bilateral testing is considered an integral part of a complete examination. Limited examinations for reoccurring indications may be performed as noted.  Summary: No HITS at rest or during Valsalva. Negative transcranial Doppler Bubble study with no evidence of right to left intracardiac communication.  A vascular evaluation was performed. The right middle cerebral artery was studied. An IV was inserted into the patient's left forearm . Verbal informed consent was obtained.  *See table(s) above for TCD measurements and observations.    Preliminary    ECHOCARDIOGRAM COMPLETE  Result Date: 04/20/2021    ECHOCARDIOGRAM REPORT   Patient Name:   Tom Lee Date of Exam: 04/20/2021 Medical Rec #:  [id number]     Height:       71.0 in Accession #:    [id number]    Weight:       215.0 lb Date of Birth:  [birth year]     BSA:          2.174 m Patient Age:    59 years      BP:           155/104 mmHg Patient Gender: M             HR:           73 bpm. Exam Location:  Inpatient Procedure: 2D Echo, Cardiac Doppler and Color Doppler Indications:    Stroke  History:        Patient has no prior history of Echocardiogram examinations.                 Stroke, Signs/Symptoms:Chest Pain; Risk Factors:Hypertension and                 Former Smoker.  Sonographer:    Glo Herring Referring Phys: [id number] [place]  1. Left ventricular ejection fraction, by estimation, is 55 to 60%. The left ventricle has normal function. The left ventricle has no regional wall motion abnormalities. There is mild left ventricular  hypertrophy. Left ventricular diastolic parameters are consistent with Grade II diastolic dysfunction (pseudonormalization).  2. Right ventricular systolic function is normal. The right ventricular size is normal. Tricuspid regurgitation signal is inadequate for assessing PA pressure.  3. The mitral valve is normal in structure. No evidence of mitral valve regurgitation. No evidence of mitral stenosis.  4. The aortic valve is tricuspid. Aortic valve regurgitation is not visualized. No aortic stenosis is present.  5. The inferior vena cava is normal in size with greater than 50% respiratory variability, suggesting right atrial pressure of 3 mmHg. FINDINGS  Left Ventricle: Left ventricular ejection fraction, by estimation, is 55 to 60%. The left ventricle has normal function. The left ventricle  has no regional wall motion abnormalities. The left ventricular internal cavity size was normal in size. There is  mild left ventricular hypertrophy. Left ventricular diastolic parameters are consistent with Grade II diastolic dysfunction (pseudonormalization). Right Ventricle: The right ventricular size is normal. No increase in right ventricular wall thickness. Right ventricular systolic function is normal. Tricuspid regurgitation signal is inadequate for assessing PA pressure. Left Atrium: Left atrial size was normal in size. Right Atrium: Right atrial size was normal in size. Pericardium: There is no evidence of pericardial effusion. Mitral Valve: The mitral valve is normal in structure. No evidence of mitral valve regurgitation. No evidence of mitral valve stenosis. Tricuspid Valve: The tricuspid valve is normal in structure. Tricuspid valve regurgitation is not demonstrated. Aortic Valve: The aortic valve is tricuspid. Aortic valve regurgitation is not visualized. No aortic stenosis is present. Aortic valve mean gradient measures 3.0 mmHg. Aortic valve peak gradient measures 5.2 mmHg. Aortic valve area, by VTI measures  2.24 cm. Pulmonic Valve: The pulmonic valve was normal in structure. Pulmonic valve regurgitation is not visualized. Aorta: The aortic root is normal in size and structure. Venous: The inferior vena cava is normal in size with greater than 50% respiratory variability, suggesting right atrial pressure of 3 mmHg. IAS/Shunts: No atrial level shunt detected by color flow Doppler.  LEFT VENTRICLE PLAX 2D LVIDd:         4.30 cm   Diastology LVIDs:         3.00 cm   LV e' medial:    7.62 cm/s LV PW:         1.30 cm   LV E/e' medial:  11.3 LV IVS:        1.30 cm   LV e' lateral:   11.90 cm/s LVOT diam:     1.90 cm   LV E/e' lateral: 7.2 LV SV:         49 LV SV Index:   23 LVOT Area:     2.84 cm  RIGHT VENTRICLE             IVC RV S prime:     11.00 cm/s  IVC diam: 1.80 cm LEFT ATRIUM             Index LA diam:        3.80 cm 1.75 cm/m LA Vol (A2C):   42.0 ml 19.32 ml/m LA Vol (A4C):   35.0 ml 16.10 ml/m LA Biplane Vol: 39.9 ml 18.35 ml/m  AORTIC VALVE                    PULMONIC VALVE AV Area (Vmax):    2.22 cm     PV Vmax:       1.11 m/s AV Area (Vmean):   2.10 cm     PV Peak grad:  4.9 mmHg AV Area (VTI):     2.24 cm AV Vmax:           114.00 cm/s AV Vmean:          77.300 cm/s AV VTI:            0.219 m AV Peak Grad:      5.2 mmHg AV Mean Grad:      3.0 mmHg LVOT Vmax:         89.40 cm/s LVOT Vmean:        57.300 cm/s LVOT VTI:          0.173 m LVOT/AV VTI ratio: 0.79  AORTA Ao Root diam: 3.00 cm Ao Asc diam:  2.90 cm MITRAL VALVE MV Area (PHT): 3.33 cm    SHUNTS MV Decel Time: 228 msec    Systemic VTI:  0.17 m MV E velocity: 86.10 cm/s  Systemic Diam: 1.90 cm MV A velocity: 57.80 cm/s MV E/A ratio:  1.49 Dalton McleanMD Electronically signed by Franki Monte Signature Date/Time: 04/20/2021/6:16:22 PM    Final    VAS [place] LOWER EXTREMITY VENOUS (DVT)  Result Date: 04/21/2021  Lower Venous DVT Study Patient Name:  Tom Lee  Date of Exam:   04/21/2021 Medical Rec #: [id number]      Accession #:     [id number] Date of Birth: 18-Jan-1966      Patient Gender: M Patient Age:   89 years Exam Location:  [facility] Procedure:      VAS [place] LOWER EXTREMITY VENOUS (DVT) Referring Phys: Cornelius Moras XU --------------------------------------------------------------------------------  Indications: Stroke, and left calf pain and tenderness x months.  Comparison Study: No prior study Performing Technologist: Maudry Mayhew MHA, RDMS, RVT, RDCS  Examination Guidelines: A complete evaluation includes B-mode imaging, spectral Doppler, color Doppler, and power Doppler as needed of all accessible portions of each vessel. Bilateral testing is considered an integral part of a complete examination. Limited examinations for reoccurring indications may be performed as noted. The reflux portion of the exam is performed with the patient in reverse Trendelenburg.  +---------+---------------+---------+-----------+----------+--------------+  RIGHT     Compressibility Phasicity Spontaneity Properties Thrombus Aging  +---------+---------------+---------+-----------+----------+--------------+  CFV       Full            Yes       Yes                                    +---------+---------------+---------+-----------+----------+--------------+  SFJ       Full                                                             +---------+---------------+---------+-----------+----------+--------------+  FV Prox   Full                                                             +---------+---------------+---------+-----------+----------+--------------+  FV Mid    Full                                                             +---------+---------------+---------+-----------+----------+--------------+  FV Distal Full                                                             +---------+---------------+---------+-----------+----------+--------------+  PFV  Full                                                              +---------+---------------+---------+-----------+----------+--------------+  POP       Full            Yes       Yes                                    +---------+---------------+---------+-----------+----------+--------------+  PTV       Full                                                             +---------+---------------+---------+-----------+----------+--------------+  PERO      Full                                                             +---------+---------------+---------+-----------+----------+--------------+   +---------+---------------+---------+-----------+----------+--------------+  LEFT      Compressibility Phasicity Spontaneity Properties Thrombus Aging  +---------+---------------+---------+-----------+----------+--------------+  CFV       Full            Yes       Yes                                    +---------+---------------+---------+-----------+----------+--------------+  SFJ       Full                                                             +---------+---------------+---------+-----------+----------+--------------+  FV Prox   Full                                                             +---------+---------------+---------+-----------+----------+--------------+  FV Mid    Full                                                             +---------+---------------+---------+-----------+----------+--------------+  FV Distal Full                                                             +---------+---------------+---------+-----------+----------+--------------+  PFV       Full                                                             +---------+---------------+---------+-----------+----------+--------------+  POP       Full            Yes       Yes                                    +---------+---------------+---------+-----------+----------+--------------+  PTV       Full                                                              +---------+---------------+---------+-----------+----------+--------------+  PERO      Full                                                             +---------+---------------+---------+-----------+----------+--------------+    Summary: RIGHT: - There is no evidence of deep vein thrombosis in the lower extremity.  - No cystic structure found in the popliteal fossa.  LEFT: - There is no evidence of deep vein thrombosis in the lower extremity.  - No cystic structure found in the popliteal fossa.  Patent varicose veins noted in the left calf in the area of pain.  *See table(s) above for measurements and observations.    Preliminary       Subjective: Patient seen and examined the bedside this morning.  Hemodynamically stable for discharge today.  Discharge Exam: Vitals:   04/21/21 0914 04/21/21 1205  BP: (!) 166/100 (!) 152/100  Pulse: 81 87  Resp: 20 20  Temp: 98.8 F (37.1 C) 98.7 F (37.1 C)  SpO2: 94% 97%   Vitals:   04/21/21 0100 04/21/21 0341 04/21/21 0914 04/21/21 1205  BP: (!) 169/105 (!) 166/103 (!) 166/100 (!) 152/100  Pulse:  87 81 87  Resp:  _0 Temp:  98.7 F (37.1 C) 98.8 F (37.1 C) 98.7 F (37.1 C)  TempSrc:  Oral Oral Oral  SpO2:  94% 94% 97%  Weight:      Height:        General: Pt is alert, awake, not in acute distress Cardiovascular: RRR, S1/S2 +, no rubs, no gallops Respiratory: CTA bilaterally, no wheezing, no rhonchi Abdominal: Soft, NT, ND, bowel sounds + Extremities: no edema, no cyanosis    The results of significant diagnostics from this hospitalization (including imaging, microbiology, ancillary and laboratory) are listed below for reference.     Microbiology: No results found for this or any previous visit (from the past 240 hour(s)).   Labs: BNP (last 3 results) No results for input(s): BNP in the last 8760 hours. Basic Metabolic Panel: Recent Labs  Lab 04/20/21 0312  NA 138  K 3.7  CL 105  CO2 25  GLUCOSE 97  BUN 13   CREATININE 0.80  CALCIUM 9.1  MG 2.0   Liver Function Tests: No results for input(s): AST, ALT, ALKPHOS, BILITOT, PROT, ALBUMIN in the last 168 hours. No results for input(s): LIPASE, AMYLASE in the last 168 hours. No results for input(s): AMMONIA in the last 168 hours. CBC: Recent Labs  Lab 04/20/21 0312  WBC 4.2  HGB 14.2  HCT 43.1  MCV 83.7  PLT 162   Cardiac Enzymes: No results for input(s): CKTOTAL, CKMB, CKMBINDEX, TROPONINI in the last 168 hours. BNP: Invalid input(s): POCBNP CBG: No results for input(s): GLUCAP in the last 168 hours. D-Dimer No results for input(s): DDIMER in the last 72 hours. Hgb A1c No results for input(s): HGBA1C in the last 72 hours. Lipid Profile Recent Labs    04/21/21 0436  CHOL 177  HDL 35*  LDLCALC 84  TRIG 292*  CHOLHDL 5.1   Thyroid function studies Recent Labs    04/21/21 0436  TSH 2.834   Anemia work up Recent Labs    04/21/21 0436  VITAMINB12 207   Urinalysis    Component Value Date/Time   COLORURINE LT. YELLOW 10/28/2012 1432   APPEARANCEUR SL CLOUDY 10/28/2012 1432   LABSPEC <=1.005 10/28/2012 1432   PHURINE 6.0 10/28/2012 1432   GLUCOSEU NEGATIVE 10/28/2012 1432   HGBUR SMALL 10/28/2012 1432   BILIRUBINUR neg 10/28/2012 1650   BILIRUBINUR NEGATIVE 10/28/2012 1432   KETONESUR NEGATIVE 10/28/2012 1432   PROTEINUR neg 10/28/2012 1650   UROBILINOGEN 0.2 10/28/2012 1650   UROBILINOGEN 0.2 10/28/2012 1432   NITRITE neg 10/28/2012 1650   NITRITE NEGATIVE 10/28/2012 1432   LEUKOCYTESUR Negative 10/28/2012 1650   Sepsis Labs Invalid input(s): PROCALCITONIN,  WBC,  LACTICIDVEN Microbiology No results found for this or any previous visit (from the past 240 hour(s)).  Please note: You were cared for by a hospitalist during your hospital stay. Once you are discharged, your primary care physician will handle any further medical issues. Please note that NO REFILLS for any discharge medications will be authorized  once you are discharged, as it is imperative that you return to your primary care physician (or establish a relationship with a primary care physician if you do not have one) for your post hospital discharge needs so that they can reassess your need for medications and monitor your lab values.    Time coordinating discharge: 40 minutes  SIGNED:   Shelly Coss, MD  [facility] 04/21/2021, 2:34 PM Pager [phone]  If 7PM-7AM, please contact night-coverage [email or web address] Password [email or web address]

## 2021-04-21 NOTE — Evaluation (Signed)
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  Pt with hx of OA and HTN.   Clinical Impression   Pt admitted for concerns listed above. PTA pt reported that he was independent with all ADL's and IADL's, including working as a Librarian, academic. At this time, pt is presenting at his baseline with no difficulties completing ADL's or functional mobility. Pt educated on safety with dizziness and fall risks. He has no further OT needs and acute OT will sign off.       Recommendations for follow up therapy are one component of a multi-disciplinary discharge planning process, led by the attending physician.  Recommendations may be updated based on patient status, additional functional criteria and insurance authorization.   Follow Up Recommendations  No OT follow up    Assistance Recommended at Discharge None  Patient can return home with the following      Functional Status Assessment  Patient has had a recent decline in their functional status and demonstrates the ability to make significant improvements in function in a reasonable and predictable amount of time.  Equipment Recommendations  None recommended by OT    Recommendations for Other Services       Precautions / Restrictions Precautions Precautions: None Restrictions Weight Bearing Restrictions: No      Mobility Bed Mobility Overal bed mobility: Independent                  Transfers Overall transfer level: Independent Equipment used: None                      Balance Overall balance assessment: No apparent balance deficits (not formally assessed)                                         ADL either performed or assessed with  clinical judgement   ADL Overall ADL's : At baseline;Independent                                       General ADL Comments: demonstrated no difficulties     Vision Baseline Vision/History: 0 No visual deficits Ability to See in Adequate Light: 0 Adequate Patient Visual Report: No change from baseline Vision Assessment?: No apparent visual deficits Additional Comments: tracking, saccades, convergence, peripherals, and functional acuity [facility]     Perception     Praxis      Pertinent Vitals/Pain Pain Assessment Pain Assessment: No/denies pain     Hand Dominance Right   Extremity/Trunk Assessment Upper Extremity Assessment Upper Extremity Assessment: Overall WFL for tasks assessed   Lower Extremity Assessment Lower Extremity Assessment: Defer to PT evaluation   Cervical / Trunk Assessment Cervical / Trunk Assessment: Normal   Communication Communication Communication: No difficulties   Cognition Arousal/Alertness: Awake/alert Behavior During Therapy: WFL for tasks assessed/performed Overall Cognitive Status: Within Functional Limits for tasks assessed  General Comments  VSS on RA, pt reporting no dizziness this session    Exercises     Shoulder Instructions      Home Living Family/patient expects to be discharged to:: Private residence Living Arrangements: Spouse/significant other Available Help at Discharge: Family;Available 24 hours/day Type of Home: House Home Access: Level entry     Home Layout: Two level;Bed/bath upstairs Alternate Level Stairs-Number of Steps: flight Alternate Level Stairs-Rails: Right Bathroom Shower/Tub: Walk-in shower;Tub/shower unit   [employer]: Standard     Home Equipment: None      Lives With: Spouse    Prior Functioning/Environment Prior Level of Function : Independent/Modified Independent;Driving;Working/employed             Mobility  Comments: works as Librarian, academic ADLs Comments: indep        OT Problem List: Decreased activity tolerance;Impaired balance (sitting and/or standing)      OT Treatment/Interventions:      OT Goals(Current goals can be found in the care plan section) Acute Rehab OT Goals Patient Stated Goal: For the dizzy episodes to stop OT Goal Formulation: With patient Time For Goal Achievement: 04/21/21 Potential to Achieve Goals: Good  OT Frequency:      Co-evaluation              AM-PAC OT "6 Clicks" Daily Activity     Outcome Measure Help from another person eating meals?: None Help from another person taking care of personal grooming?: None Help from another person toileting, which includes using toliet, bedpan, or urinal?: None Help from another person bathing (including washing, rinsing, drying)?: None Help from another person to put on and taking off regular upper body clothing?: None Help from another person to put on and taking off regular lower body clothing?: None 6 Click Score: 24   End of Session Nurse Communication: Mobility status  Activity Tolerance: Patient tolerated treatment well Patient left: in bed;with call bell/phone within reach  OT Visit Diagnosis: Unsteadiness on feet (R26.81);Other abnormalities of gait and mobility (R26.89);Muscle weakness (generalized) (M62.81)                Time: [id number] OT Time Calculation (min): 13 min Charges:  OT General Charges $OT Visit: 1 Visit OT Evaluation $OT Eval Low Complexity: [place]., OTR/L Acute Rehabilitation  Madelynne Lasker Elane Yolanda Bonine 04/21/2021, 1:16 PM

## 2021-04-21 NOTE — TOC Transition Note (Signed)
Transition of Care [facility]) - CM/SW Discharge Note   Patient Details  Name: Tom Lee MRN: [id number] Date of Birth: 02/03/1966  Transition of Care [facility]) CM/SW Contact:  Pollie Friar, RN Phone Number: 04/21/2021, 3:43 PM   Clinical Narrative:    Pt is discharging home with self care. No needs per TOC.   Final next level of care: Home/Self Care Barriers to Discharge: No Barriers Identified   Patient Goals and CMS Choice        Discharge Placement                       Discharge Plan and Services                                     Social Determinants of Health (SDOH) Interventions     Readmission Risk Interventions No flowsheet data found.

## 2021-04-21 NOTE — Progress Notes (Signed)
°  Transition of Care [facility]) Screening Note   Patient Details  Name: Tom Lee Date of Birth: [birth year]   Transition of Care [facility]) CM/SW Contact:    Pollie Friar, RN Phone Number: 04/21/2021, 2:27 PM    Transition of Care Department [facility]) has reviewed patient and no TOC needs have been identified at this time. We will continue to monitor patient advancement through interdisciplinary progression rounds. If new patient transition needs arise, please place a TOC consult.

## 2021-04-21 NOTE — Progress Notes (Signed)
Patient ID: Tom Lee, male   DOB: November 23, 1965, 56 y.o.   MRN: [id number] Patient enrolled for [employer] to ship a 30 day cardiac event monitor to his home. Letter with instructions mailed to patient.

## 2021-04-22 LAB — HOMOCYSTEINE: Homocysteine: 13.6 umol/L (ref 0.0–14.5)

## 2021-04-22 LAB — LUPUS ANTICOAGULANT PANEL
DRVVT: 27.6 s (ref 0.0–47.0)
PTT Lupus Anticoagulant: 34.5 s (ref 0.0–51.9)

## 2021-04-22 LAB — BETA-2-GLYCOPROTEIN I ABS, IGG/M/A
Beta-2 Glyco I IgG: <9 GPI IgG units (ref 0–20)
Beta-2-Glycoprotein I IgA: <9 GPI IgA units (ref 0–25)
Beta-2-Glycoprotein I IgM: 19 GPI IgM units (ref 0–32)

## 2021-04-22 LAB — RPR: RPR Ser Ql: NONREACTIVE

## 2021-04-24 LAB — PHOSPHATIDYLSERINE ANTIBODIES
Phosphatydalserine, IgA: 13 APS Units (ref 0–19)
Phosphatydalserine, IgG: <9 Units (ref 0–30)
Phosphatydalserine, IgM: 46 Units — ABNORMAL HIGH (ref 0–30)

## 2021-04-24 LAB — CARDIOLIPIN ANTIBODIES, IGG, IGM, IGA
Anticardiolipin IgA: >150 APL U/mL — ABNORMAL HIGH (ref 0–11)
Anticardiolipin IgG: <9 GPL U/mL (ref 0–14)
Anticardiolipin IgM: 28 MPL U/mL — ABNORMAL HIGH (ref 0–12)

## 2021-04-26 LAB — ANTINUCLEAR ANTIBODIES, IFA: ANA Ab, IFA: POSITIVE — AB

## 2021-04-26 LAB — FANA STAINING PATTERNS: Homogeneous Pattern: 1

## 2021-04-28 DIAGNOSIS — F3341 Major depressive disorder, recurrent, in partial remission: Secondary | ICD-10-CM | POA: Diagnosis not present

## 2021-04-28 DIAGNOSIS — Z125 Encounter for screening for malignant neoplasm of prostate: Secondary | ICD-10-CM | POA: Diagnosis not present

## 2021-04-28 DIAGNOSIS — Z0000 Encounter for general adult medical examination without abnormal findings: Secondary | ICD-10-CM | POA: Diagnosis not present

## 2021-04-28 DIAGNOSIS — E782 Mixed hyperlipidemia: Secondary | ICD-10-CM | POA: Diagnosis not present

## 2021-04-28 DIAGNOSIS — N529 Male erectile dysfunction, unspecified: Secondary | ICD-10-CM | POA: Diagnosis not present

## 2021-04-28 DIAGNOSIS — I10 Essential (primary) hypertension: Secondary | ICD-10-CM | POA: Diagnosis not present

## 2021-05-05 DIAGNOSIS — Z8673 Personal history of transient ischemic attack (TIA), and cerebral infarction without residual deficits: Secondary | ICD-10-CM | POA: Diagnosis not present

## 2021-05-05 DIAGNOSIS — I10 Essential (primary) hypertension: Secondary | ICD-10-CM | POA: Diagnosis not present

## 2021-05-11 ENCOUNTER — Ambulatory Visit (INDEPENDENT_AMBULATORY_CARE_PROVIDER_SITE_OTHER): Payer: BC Managed Care – PPO

## 2021-05-11 DIAGNOSIS — R42 Dizziness and giddiness: Secondary | ICD-10-CM

## 2021-05-11 DIAGNOSIS — I4891 Unspecified atrial fibrillation: Secondary | ICD-10-CM | POA: Diagnosis not present

## 2021-05-11 DIAGNOSIS — I639 Cerebral infarction, unspecified: Secondary | ICD-10-CM

## 2021-05-25 ENCOUNTER — Inpatient Hospital Stay: Payer: Self-pay | Admitting: Neurology

## 2021-05-25 NOTE — Progress Notes (Unsigned)
PATIENT: Tom Lee DOB: 03/12/1966  REASON FOR VISIT: Follow up HISTORY FROM: Patient PRIMARY NEUROLOGIST:   HISTORY OF PRESENT ILLNESS: Today 05/25/21 Tom Lee here today for stroke follow-up from hospitalization April 20, 2021.  Presented with dizziness and HTN.  MRI of the brain showed bilateral cerebellar PICA infarcts.,  Etiology unclear, concerning for cardioembolic source.   -CTA head and neck showed no significant stenosis, occlusion, or dissection. PICA was identified bilaterally.  Diminutive right vertebral artery, dominant left vertebral artery, a normal variant -2D echo EF 55 to 60% -DVT US: No evidence of DVT -TCD bubble study no PFO -LDL 84 -A1C 5.7 -DAPT for 3 weeks then Aspirin alone -Hypercoagulable work-up (cardiolipin antibodies IgM increased 28, IgA greater than 150; ANA positive, phosphatydalserine IgM increased 46) -Recommended 30 day cardiac monitor to rule out A-fib, outpatient TEE  HISTORY  Copied Dr. Johny Chess Note 04/20/2021: HPI: Tom Lee is a 57 y.o. male with past medical history of osteoarthritis, mild childhood asthma, SVT in May 2020, hypertension who had not been taking his antihypertensives (amlodipine) in the past 2 months due to bilateral lower extremity cramps who presents today reporting 2 days of fluctuating dizziness that affected his vision. He states that this began Monday and he was feeling "sluggish". He states that he did a "detox" with dulcolax and then thought he was just dehydrated. He tried to push water and Pedialyte and then went to bed feeling a little better, but still slightly dizzy. On Tuesday at about 0130 in the morning he woke up to the room spinning and he took his blood pressure. He noted that his BPs read 198/105, 199/110. He then took $RemoveB'10mg'XOkZAilU$  of his amlodipine in the morning and $RemoveBef'10mg'lmlZLMXgqG$  of amlodipine at night. He retook his blood pressure Tuesday night and got readings of 185/101, 177/95, and 197/105 and decided to come to  the ED because of his hypertension and dizziness. He denied headache, unilateral weakness or paresthesias, difficulty finding words, comprehending, talking or slurred speech.  No fever, chills, sore throat, rhinorrhea, dyspnea, chest pain, palpitations, PND, orthopnea, but get occasional lower extremity edema.  He has been getting constipation.  He has been using kratom, but denied abdominal pain, diarrhea, melena or hematochezia.  No dysuria, flank pain, frequency or hematuria.  No polyuria, polyphagia or blurred vision. He is currently not having any dizziness. He does report the last occurrence as 0830 this morning.   REVIEW OF SYSTEMS: Out of a complete 14 system review of symptoms, the patient complains only of the following symptoms, and all other reviewed systems are negative.  ALLERGIES: Allergies  Allergen Reactions   Diphenhydramine Hcl     REACTION: increased swelling and hives   Hydrocodone Nausea Only   Diphenhydramine Other (See Comments)   Escitalopram Oxalate     HOME MEDICATIONS: Outpatient Medications Prior to Visit  Medication Sig Dispense Refill   albuterol (VENTOLIN HFA) 108 (90 BASE) MCG/ACT inhaler Inhale 2 puffs into the lungs every 6 (six) hours as needed. (Patient taking differently: Inhale 2 puffs into the lungs every 6 (six) hours as needed for wheezing or shortness of breath.) 18 g 1   aspirin EC 81 MG EC tablet Take 1 tablet (81 mg total) by mouth daily. Swallow whole. 30 tablet 1   atorvastatin (LIPITOR) 40 MG tablet Take 1 tablet (40 mg total) by mouth daily. 30 tablet 1   Blood Pressure KIT 1 each by Does not apply route 2 times daily at 12  noon and 4 pm. 1 kit 0   calcium carbonate (ALKA-SELTZER HEARTBURN) 750 MG chewable tablet Chew 2 tablets by mouth every 6 (six) hours as needed for heartburn.     clopidogrel (PLAVIX) 75 MG tablet Take 1 tablet (75 mg total) by mouth daily. 20 tablet 0   Ibuprofen 200 MG CAPS Take 200 mg by mouth daily as needed (pain).      lisinopril (ZESTRIL) 10 MG tablet Take 1 tablet (10 mg total) by mouth daily. 30 tablet 1   magnesium hydroxide (DULCOLAX MILK OF MAGNESIA) 400 MG/5ML suspension Take 30 mLs by mouth daily as needed for mild constipation.     naproxen sodium (ALEVE) 220 MG tablet Take 440 mg by mouth daily as needed (pain).     No facility-administered medications prior to visit.    PAST MEDICAL HISTORY: Past Medical History:  Diagnosis Date   Arthritis    Asthma    dx as a child, no symptoms x years   Hypertension 2011    PAST SURGICAL HISTORY: Past Surgical History:  Procedure Laterality Date   broken ankle  2008   had surgery   HERNIA REPAIR  10/2009    FAMILY HISTORY: Family History  Problem Relation Age of Onset   Hypertension Mother        CHF   Stroke Mother        x 2   Diabetes Mother    Hypertension Father    Stroke Brother    Heart attack Neg Hx    Colon cancer Neg Hx    Prostate cancer Neg Hx     SOCIAL HISTORY: Social History   Socioeconomic History   Marital status: Married    Spouse name: Not on file   Number of children: 5   Years of education: Not on file   Highest education level: Not on file  Occupational History   Not on file  Tobacco Use   Smoking status: Former   Smokeless tobacco: Never   Tobacco comments:    11-12 years as of 2012  Substance and Sexual Activity   Alcohol use: No   Drug use: No   Sexual activity: Not on file  Other Topics Concern   Not on file  Social History Narrative   Not on file   Social Determinants of Health   Financial Resource Strain: Not on file  Food Insecurity: Not on file  Transportation Needs: Not on file  Physical Activity: Not on file  Stress: Not on file  Social Connections: Not on file  Intimate Partner Violence: Not on file      PHYSICAL EXAM  There were no vitals filed for this visit. There is no height or weight on file to calculate BMI.  Generalized: Well developed, in no acute distress    Neurological examination  Mentation: Alert oriented to time, place, history taking. Follows all commands speech and language fluent Cranial nerve II-XII: Pupils were equal round reactive to light. Extraocular movements were full, visual field were full on confrontational test. Facial sensation and strength were normal. Uvula tongue midline. Head turning and shoulder shrug  were normal and symmetric. Motor: The motor testing reveals 5 over 5 strength of all 4 extremities. Good symmetric motor tone is noted throughout.  Sensory: Sensory testing is intact to soft touch on all 4 extremities. No evidence of extinction is noted.  Coordination: Cerebellar testing reveals good finger-nose-finger and heel-to-shin bilaterally.  Gait and station: Gait is normal. Tandem gait is  normal. Romberg is negative. No drift is seen.  Reflexes: Deep tendon reflexes are symmetric and normal bilaterally.   DIAGNOSTIC DATA (LABS, IMAGING, TESTING) - I reviewed patient records, labs, notes, testing and imaging myself where available.  Lab Results  Component Value Date   WBC 4.2 04/20/2021   HGB 14.2 04/20/2021   HCT 43.1 04/20/2021   MCV 83.7 04/20/2021   PLT 162 04/20/2021      Component Value Date/Time   NA 138 04/20/2021 0312   K 3.7 04/20/2021 0312   CL 105 04/20/2021 0312   CO2 25 04/20/2021 0312   GLUCOSE 97 04/20/2021 0312   BUN 13 04/20/2021 0312   CREATININE 0.80 04/20/2021 0312   CALCIUM 9.1 04/20/2021 0312   PROT 7.4 08/04/2011 1317   ALBUMIN 4.5 08/04/2011 1317   AST 27 08/04/2011 1317   ALT 33 08/04/2011 1317   ALKPHOS 59 08/04/2011 1317   BILITOT 0.7 08/04/2011 1317   GFRNONAA >60 04/20/2021 0312   GFRAA >60 07/31/2018 0706   Lab Results  Component Value Date   CHOL 177 04/21/2021   HDL 35 (L) 04/21/2021   LDLCALC 84 04/21/2021   LDLDIRECT 52.0 08/04/2011   TRIG 292 (H) 04/21/2021   CHOLHDL 5.1 04/21/2021   Lab Results  Component Value Date   HGBA1C 5.7 (H) 04/21/2021    Lab Results  Component Value Date   VITAMINB12 207 04/21/2021   Lab Results  Component Value Date   TSH 2.834 04/21/2021    ASSESSMENT AND PLAN 56 y.o. year old male   1.  Stroke, bilateral cerebellar PICA infarcts -Etiology unclear, concerning for cardioembolic source -Continue aspirin 81 mg daily -In 12 weeks, recheck labs for hypercoagulable workup, will send myself reminder (anti-beta 2 glycoprotein, cardiolipin antibodies, lupus panel, ANA with reflex), checking for antiphospholipid sydrome  2.  Hypertension -Goal < 130/80  3.  Hyperlipidemia LDL 84, goal < 70 Continue Lipitor [address] [place], [place] 05/25/2021, 5:48 AM [facility] [address], [place], [place] [address] [phone]

## 2021-05-26 ENCOUNTER — Ambulatory Visit: Payer: BC Managed Care – PPO | Admitting: Neurology

## 2021-05-26 ENCOUNTER — Encounter: Payer: Self-pay | Admitting: Neurology

## 2021-05-26 VITALS — BP 160/90 | HR 72 | Ht 71.0 in | Wt 220.0 lb

## 2021-05-26 DIAGNOSIS — I639 Cerebral infarction, unspecified: Secondary | ICD-10-CM | POA: Diagnosis not present

## 2021-05-26 DIAGNOSIS — E785 Hyperlipidemia, unspecified: Secondary | ICD-10-CM | POA: Diagnosis not present

## 2021-05-26 DIAGNOSIS — I10 Essential (primary) hypertension: Secondary | ICD-10-CM

## 2021-05-26 NOTE — Patient Instructions (Addendum)
Follow up with primary care about BP, goal < 130/80 ?Check labs again for hypercoagulable state in April 2023 ?Continue wearing heart monitor  ?Continue aspirin 81 mg daily ?

## 2021-05-26 NOTE — Progress Notes (Signed)
PATIENT: Tom Lee DOB: 05-18-1965  REASON FOR VISIT: Stroke follow-up  HISTORY FROM: Patient  HISTORY OF PRESENT ILLNESS: Today 05/26/21 Tom Lee here today for stroke follow-up from hospitalization April 20, 2021.  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 Is currently wearing heart monitor, is 20 days in. When d/c he had episodic dizziness, this was baseline, improved a lot. On day of ER visit the dizziness, would feel tingles down his legs. Would sit down and fade off. No further spells since d/c. BP up today, just took his medications 1 hour ago on the way here. BP at home runs 544-920 systolic, 10'O diastolic. 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 Is doing plant based diet now. Was on amlodipine but had cramps in legs, he was off when he had this stroke. After the stroke, he was started on Lisinopril, some cough now. Also on HCTZ now. Seeing PCP tomorrow.  -CTA head and neck showed no significant stenosis, occlusion, or dissection. PICA was identified bilaterally.  Diminutive right vertebral artery, dominant left vertebral artery, a normal variant -2D echo EF 55 to 60% -DVT US: No evidence of DVT -TCD bubble study no PFO -LDL 84 -A1C 5.7 -DAPT for 3 weeks then Aspirin alone -Hypercoagulable work-up (cardiolipin antibodies IgM increased 28, IgA greater than 150; ANA positive, phosphatydalserine IgM increased 46) -Recommended 30 day cardiac monitor to rule out A-fib, outpatient TEE  HISTORY  Copied Dr. Johny Chess Note 04/20/2021: HPI: Tom Lee is a 56 y.o. male with past medical history of osteoarthritis, mild childhood asthma, SVT in May 2020, hypertension who had not been taking his antihypertensives (amlodipine) in the past 2 months due to bilateral lower extremity cramps who presents today reporting 2 days of fluctuating dizziness that affected  his vision. He states that this began Monday and he was feeling "sluggish". He states that he did a "detox" with dulcolax and then thought he was just dehydrated. He tried to push water and Pedialyte and then went to bed feeling a little better, but still slightly dizzy. On Tuesday at about 0130 in the morning he woke up to the room spinning and he took his blood pressure. He noted that his BPs read 198/105, 199/110. He then took $RemoveB'10mg'kyaTvnQk$  of his amlodipine in the morning and $RemoveBef'10mg'XlQFuDCrRQ$  of amlodipine at night. He retook his blood pressure Tuesday night and got readings of 185/101, 177/95, and 197/105 and decided to come to the ED because of his hypertension and dizziness. He denied headache, unilateral weakness or paresthesias, difficulty finding words, comprehending, talking or slurred speech.  No fever, chills, sore throat, rhinorrhea, dyspnea, chest pain, palpitations, PND, orthopnea, but get occasional lower extremity edema.  He has been getting constipation.  He has been using kratom, but denied abdominal pain, diarrhea, melena or hematochezia.  No dysuria, flank pain, frequency or hematuria.  No polyuria, polyphagia or blurred vision. He is currently not having any dizziness. He does report the last occurrence as 0830 this morning.   REVIEW OF SYSTEMS: Out of a complete 14 system review of symptoms, the patient complains only of the following symptoms, and all other reviewed systems are negative.  See HPI  ALLERGIES: Allergies  Allergen Reactions   Diphenhydramine Hcl     REACTION: increased swelling and hives   Hydrocodone Nausea Only   Diphenhydramine Other (See Comments)   Escitalopram Oxalate  HOME MEDICATIONS: Outpatient Medications Prior to Visit  Medication Sig Dispense Refill   albuterol (VENTOLIN HFA) 108 (90 BASE) MCG/ACT inhaler Inhale 2 puffs into the lungs every 6 (six) hours as needed. 18 g 1   aspirin EC 81 MG EC tablet Take 1 tablet (81 mg total) by mouth daily. Swallow whole. 30  tablet 1   atorvastatin (LIPITOR) 40 MG tablet Take 1 tablet (40 mg total) by mouth daily. 30 tablet 1   Blood Pressure KIT 1 each by Does not apply route 2 times daily at 12 noon and 4 pm. 1 kit 0   calcium carbonate (ALKA-SELTZER HEARTBURN) 750 MG chewable tablet Chew 2 tablets by mouth every 6 (six) hours as needed for heartburn.     clopidogrel (PLAVIX) 75 MG tablet Take 1 tablet (75 mg total) by mouth daily. 20 tablet 0   hydrochlorothiazide (HYDRODIURIL) 12.5 MG tablet Take 12.5 mg by mouth every morning.     Ibuprofen 200 MG CAPS Take 200 mg by mouth daily as needed (pain).     lisinopril (ZESTRIL) 10 MG tablet Take 1 tablet (10 mg total) by mouth daily. 30 tablet 1   magnesium hydroxide (DULCOLAX MILK OF MAGNESIA) 400 MG/5ML suspension Take 30 mLs by mouth daily as needed for mild constipation.     naproxen sodium (ALEVE) 220 MG tablet Take 440 mg by mouth daily as needed (pain).     No facility-administered medications prior to visit.    PAST MEDICAL HISTORY: Past Medical History:  Diagnosis Date   Arthritis    Asthma    dx as a child, no symptoms x years   Hypertension 2011    PAST SURGICAL HISTORY: Past Surgical History:  Procedure Laterality Date   broken ankle  2008   had surgery   HERNIA REPAIR  10/2009    FAMILY HISTORY: Family History  Problem Relation Age of Onset   Hypertension Mother        CHF   Stroke Mother        x 2   Diabetes Mother    Hypertension Father    Stroke Brother    Heart attack Neg Hx    Colon cancer Neg Hx    Prostate cancer Neg Hx     SOCIAL HISTORY: Social History   Socioeconomic History   Marital status: Married    Spouse name: Not on file   Number of children: 5   Years of education: Not on file   Highest education level: Not on file  Occupational History   Not on file  Tobacco Use   Smoking status: Former   Smokeless tobacco: Never   Tobacco comments:    11-12 years as of 2012  Substance and Sexual Activity    Alcohol use: No   Drug use: No   Sexual activity: Not on file  Other Topics Concern   Not on file  Social History Narrative   Not on file   Social Determinants of Health   Financial Resource Strain: Not on file  Food Insecurity: Not on file  Transportation Needs: Not on file  Physical Activity: Not on file  Stress: Not on file  Social Connections: Not on file  Intimate Partner Violence: Not on file   PHYSICAL EXAM  Vitals:   05/26/21 0935 05/26/21 1038  BP: (!) 173/102 (!) 160/90  Pulse: 72   Weight: 220 lb (99.8 kg)   Height: $Remove'5\' 11"'JdAHtZV$  (1.803 m)    Body mass index is 30.68  kg/m.  Generalized: Well developed, in no acute distress   Neurological examination  Mentation: Alert oriented to time, place, history taking. Follows all commands speech and language fluent Cranial nerve II-XII: Pupils were equal round reactive to light. Extraocular movements were full, visual field were full on confrontational test. Facial sensation and strength were normal.  Head turning and shoulder shrug  were normal and symmetric. Motor: The motor testing reveals 5 over 5 strength of all 4 extremities. Good symmetric motor tone is noted throughout.  Sensory: Sensory testing is intact to soft touch on all 4 extremities. No evidence of extinction is noted.  Coordination: Cerebellar testing reveals good finger-nose-finger and heel-to-shin bilaterally.  Gait and station: Gait is normal. Tandem gait is normal. Romberg is negative. No drift is seen.  Reflexes: Deep tendon reflexes are symmetric and normal bilaterally.   DIAGNOSTIC DATA (LABS, IMAGING, TESTING) - I reviewed patient records, labs, notes, testing and imaging myself where available.  Lab Results  Component Value Date   WBC 4.2 04/20/2021   HGB 14.2 04/20/2021   HCT 43.1 04/20/2021   MCV 83.7 04/20/2021   PLT 162 04/20/2021      Component Value Date/Time   NA 138 04/20/2021 0312   K 3.7 04/20/2021 0312   CL 105 04/20/2021 0312    CO2 25 04/20/2021 0312   GLUCOSE 97 04/20/2021 0312   BUN 13 04/20/2021 0312   CREATININE 0.80 04/20/2021 0312   CALCIUM 9.1 04/20/2021 0312   PROT 7.4 08/04/2011 1317   ALBUMIN 4.5 08/04/2011 1317   AST 27 08/04/2011 1317   ALT 33 08/04/2011 1317   ALKPHOS 59 08/04/2011 1317   BILITOT 0.7 08/04/2011 1317   GFRNONAA >60 04/20/2021 0312   GFRAA >60 07/31/2018 0706   Lab Results  Component Value Date   CHOL 177 04/21/2021   HDL 35 (L) 04/21/2021   LDLCALC 84 04/21/2021   LDLDIRECT 52.0 08/04/2011   TRIG 292 (H) 04/21/2021   CHOLHDL 5.1 04/21/2021   Lab Results  Component Value Date   HGBA1C 5.7 (H) 04/21/2021   Lab Results  Component Value Date   VITAMINB12 207 04/21/2021   Lab Results  Component Value Date   TSH 2.834 04/21/2021    ASSESSMENT AND PLAN 56 y.o. year old male   1.  Stroke, bilateral cerebellar PICA infarcts -Etiology unclear, concerning for cardioembolic source -No residual deficits or further dizzy spells -Continue aspirin 81 mg daily -In 12 weeks, recheck labs for hypercoagulable workup (positive cardiolipin antibodies IgM, IgA; ANA; phosphatydalserine IgM 46) will send myself reminder (anti-beta 2 glycoprotein, cardiolipin antibodies, lupus panel, ANA with reflex), checking for antiphospholipid sydrome -Wearing cardiac heart monitor now, 20 days in, if negative for abnormal rhythms, order TEE/loop recorder, seeing cardiology, Dr. Duke Salvia in March -Will see back in 3-4 months to ensure complete work up, for any new or concerning stroke symptoms, go to the ER immediately   2.  Hypertension -Not at goal, seeing PCP tomorrow, currently on lisinopril, HCTZ, these are recent changes, was off CCB at time of stroke due to side effect- keeps log at home, that is close to goal 130-140/80's, just took meds on the way here -Goal < 130/80  3.  Hyperlipidemia -LDL 84, goal < 70 -Continue Lipitor 40 -On plant based diet now  4. History of SVT -Wearing heart  monitor, seeing cardiology   Margie Ege, AGNP-C, DNP 05/26/2021, 10:39 AM [facility] [address] [place], [place] [address] [phone]

## 2021-05-27 DIAGNOSIS — I10 Essential (primary) hypertension: Secondary | ICD-10-CM | POA: Diagnosis not present

## 2021-05-27 DIAGNOSIS — G4700 Insomnia, unspecified: Secondary | ICD-10-CM | POA: Diagnosis not present

## 2021-05-27 DIAGNOSIS — F411 Generalized anxiety disorder: Secondary | ICD-10-CM | POA: Diagnosis not present

## 2021-05-27 DIAGNOSIS — F3341 Major depressive disorder, recurrent, in partial remission: Secondary | ICD-10-CM | POA: Diagnosis not present

## 2021-06-06 ENCOUNTER — Encounter (HOSPITAL_BASED_OUTPATIENT_CLINIC_OR_DEPARTMENT_OTHER): Payer: Self-pay | Admitting: Cardiovascular Disease

## 2021-06-06 ENCOUNTER — Ambulatory Visit (HOSPITAL_BASED_OUTPATIENT_CLINIC_OR_DEPARTMENT_OTHER): Payer: BC Managed Care – PPO | Admitting: Cardiovascular Disease

## 2021-06-06 ENCOUNTER — Encounter (HOSPITAL_BASED_OUTPATIENT_CLINIC_OR_DEPARTMENT_OTHER): Payer: Self-pay | Admitting: *Deleted

## 2021-06-06 ENCOUNTER — Other Ambulatory Visit: Payer: Self-pay

## 2021-06-06 DIAGNOSIS — E785 Hyperlipidemia, unspecified: Secondary | ICD-10-CM

## 2021-06-06 DIAGNOSIS — I639 Cerebral infarction, unspecified: Secondary | ICD-10-CM | POA: Diagnosis not present

## 2021-06-06 DIAGNOSIS — I10 Essential (primary) hypertension: Secondary | ICD-10-CM | POA: Diagnosis not present

## 2021-06-06 MED ORDER — SPIRONOLACTONE 25 MG PO TABS
25.0000 mg | ORAL_TABLET | Freq: Every day | ORAL | 1 refills | Status: AC
Start: 2021-06-06 — End: 2021-09-04

## 2021-06-06 NOTE — Assessment & Plan Note (Signed)
Blood pressure is very poorly controlled.  He has been intolerant to several medicines.  We will try adding spironolactone 25 mg daily and checking a BMP in a week.  Continue losartan and carvedilol.  I am suspicious that he will end up with resistant hypertension and his blood pressure will remain uncontrolled.  At that time we will need to do a more thorough secondary work-up.  For now, he was given an advance hypertension clinic booklet and asked to check his blood pressures twice daily.  He was also enrolled in the [facility] program.  He was encouraged to keep up his exercise and increase to at least 150 minutes and to continue limiting the sodium in his diet. ?

## 2021-06-06 NOTE — Assessment & Plan Note (Addendum)
Continue atorvastatin.  LDL was slightly above goal when checked in January.  However he has since started a plant-based diet.  We will repeat lipids and a CMP at follow-up and consider increasing atorvastatin if his LDL remains greater than 70. ?

## 2021-06-06 NOTE — Assessment & Plan Note (Signed)
Patient was admitted with CVA in the setting of being off his antihypertensives.  He now understands the importance of taking them as prescribed.  Blood pressure goal is less than 130/80.  We are adding spironolactone today.  Continue losartan and carvedilol.  Continue atorvastatin and aspirin.  His 30-day monitor is almost over and I will read it when it is completed. ?

## 2021-06-06 NOTE — Progress Notes (Signed)
Cardiology Office Note:    Date:  06/06/2021   ID:  Tom Lee, DOB May 07, 1965, MRN [id number]  PCP:  Tom Loader, FNP   [facility] Providers Cardiologist:  None     Referring MD: Tom Loader, FNP   No chief complaint on file.   History of Present Illness:    Tom Lee is a 56 y.o. male with a hx of stroke, hyperlipidemia, hypertension, asthma, and arthritis, here to establish care. He saw his PCP 05/2020 and his blood pressure was 162/92. He noted he was not taking his HCTZ every day because it was causing kidney pain. He also reported having a cough on lisinopril. HCTZ and lisinopril were stopped and he was started on losartan. Carvedilol was increased. He was admitted 03/2021 with a stroke in the setting of being off his antihypertensives for 2 months. Head CT showed a cerebellar infarct. Echo revealed LVEF 55-60% and grade 2 diastolic dysfunction. A 30-day ambulatory monitor was ordered but has not yet been completed.  Today, he is feeling pretty good overall. Every so often he becomes a little dizzy, but not like it was before. He attributes this to side effects of his medication. During his stroke when his dizziness affected his occipital region, he would feel a funny sensation radiating in his lower legs. Lately his dizziness is more focal to his frontal head and he would not describe it as "room-spinning." Additionally he complains of some sinus pressure. Just prior to his stroke, he was lying in bed, and as he tried to get up he suffered room-spinning dizziness. At home, his blood pressure has been 155/92-96 on average. At one time after walking his BP was 129/80, but rose the next day to 145/88. Typically he takes his antihypertensives around 9 AM. He did take them this morning. Previously he was on amlodipine, but this was stopped due to bilateral LE muscle cramps. Due to the changing weather he has suffered some shortness of breath that he relates to issues with asthma  in the past. His inhaler helps improve his symptoms. Of note he also complains of some bilateral flank pain and tightness. For exercise he is walking twice a week for about 30 minutes. He felt good returning home, and walked on the treadmill for 10 more minutes. At work he tries to remain on his feet. Since his stroke, he has tried switching to a plant based diet. This has caused some minor bloating for him. He used to drink 1 cup of coffee a day. He is now drinking caffeine-free ginger ale, and cranberry juice. He is no longer consuming alcohol. When he lies flat he doesn't sleep well. Generally he also doesn't sleep well due to waking up every 1.5 hours like clockwork throughout the night. He has not yet tried melatonin. His wife reports that he snores infrequently. He denies any palpitations, or chest pain. No syncope, orthopnea, or PND.   Past Medical History:  Diagnosis Date   Arthritis    Asthma    dx as a child, no symptoms x years   Hypertension 2011    Past Surgical History:  Procedure Laterality Date   broken ankle  2008   had surgery   HERNIA REPAIR  10/2009    Current Medications: Current Meds  Medication Sig   albuterol (VENTOLIN HFA) 108 (90 BASE) MCG/ACT inhaler Inhale 2 puffs into the lungs every 6 (six) hours as needed.   aspirin EC 81 MG EC tablet Take  1 tablet (81 mg total) by mouth daily. Swallow whole.   atorvastatin (LIPITOR) 40 MG tablet Take 1 tablet (40 mg total) by mouth daily.   Blood Pressure KIT 1 each by Does not apply route 2 times daily at 12 noon and 4 pm.   carvedilol (COREG) 12.5 MG tablet Take 12.5 mg by mouth 2 (two) times daily.   Ibuprofen 200 MG CAPS Take 200 mg by mouth daily as needed (pain).   losartan (COZAAR) 100 MG tablet Take 100 mg by mouth daily.   naproxen sodium (ALEVE) 220 MG tablet Take 440 mg by mouth daily as needed (pain).   spironolactone (ALDACTONE) 25 MG tablet Take 1 tablet (25 mg total) by mouth daily.     Allergies:    Diphenhydramine hcl, Hydrocodone, Diphenhydramine, and Escitalopram oxalate   Social History   Socioeconomic History   Marital status: Married    Spouse name: Not on file   Number of children: 5   Years of education: Not on file   Highest education level: Not on file  Occupational History   Not on file  Tobacco Use   Smoking status: Former   Smokeless tobacco: Never   Tobacco comments:    11-12 years as of 2012  Substance and Sexual Activity   Alcohol use: No   Drug use: No   Sexual activity: Not on file  Other Topics Concern   Not on file  Social History Narrative   Not on file   Social Determinants of Health   Financial Resource Strain: Low Risk    Difficulty of Paying Living Expenses: Not hard at all  Food Insecurity: No Food Insecurity   Worried About Charity fundraiser in the Last Year: Never true   [place] in the Last Year: Never true  Transportation Needs: No Transportation Needs   Lack of Transportation (Medical): No   Lack of Transportation (Non-Medical): No  Physical Activity: Insufficiently Active   Days of Exercise per Week: 2 days   Minutes of Exercise per Session: 40 min  Stress: Not on file  Social Connections: Not on file     Family History: The patient's family history includes Diabetes in his mother; Heart failure (age of onset: 29) in his mother; Hypertension in his father and mother; Stroke in his brother and mother. There is no history of Heart attack, Colon cancer, or Prostate cancer.  ROS:   Please see the history of present illness.    (+) Dizziness (+) Sinus pressure (+) Shortness of breath (+) Bilateral flank pain (+) Snoring All other systems reviewed and are negative.  EKGs/Labs/Other Studies Reviewed:    The following studies were reviewed today:  Transcranial Doppler with Bubble Study 04/21/2021: Summary:  No HITS at rest or during Valsalva. Negative transcranial Doppler Bubble  study with no evidence of right to left  intracardiac communication.     A vascular evaluation was performed. The right middle cerebral artery was studied. An IV was inserted into the patient's left forearm . Verbal  informed consent was obtained.     NEGATIVE TCD Bubble study not suggestive of ant right to left shunt.    Bilateral LE Venous Doppler 04/21/2021: Summary:  RIGHT:  - There is no evidence of deep vein thrombosis in the lower extremity.     - No cystic structure found in the popliteal fossa.     LEFT:  - There is no evidence of deep vein thrombosis in the lower extremity.     -  No cystic structure found in the popliteal fossa.     Patent varicose veins noted in the left calf in the area of pain.  Echo 04/20/2021:  1. Left ventricular ejection fraction, by estimation, is 55 to 60%. The  left ventricle has normal function. The left ventricle has no regional  wall motion abnormalities. There is mild left ventricular hypertrophy.  Left ventricular diastolic parameters  are consistent with Grade II diastolic dysfunction (pseudonormalization).   2. Right ventricular systolic function is normal. The right ventricular  size is normal. Tricuspid regurgitation signal is inadequate for assessing  PA pressure.   3. The mitral valve is normal in structure. No evidence of mitral valve  regurgitation. No evidence of mitral stenosis.   4. The aortic valve is tricuspid. Aortic valve regurgitation is not  visualized. No aortic stenosis is present.   5. The inferior vena cava is normal in size with greater than 50%  respiratory variability, suggesting right atrial pressure of 3 mmHg.  EKG:   EKG is personally reviewed. 06/06/2021: EKG was not ordered.   Recent Labs: 04/20/2021: BUN 13; Creatinine, Ser 0.80; Hemoglobin 14.2; Magnesium 2.0; Platelets 162; Potassium 3.7; Sodium 138 04/21/2021: TSH 2.834   Recent Lipid Panel    Component Value Date/Time   CHOL 177 04/21/2021 0436   TRIG 292 (H) 04/21/2021 0436   HDL 35  (L) 04/21/2021 0436   CHOLHDL 5.1 04/21/2021 0436   VLDL 58 (H) 04/21/2021 0436   LDLCALC 84 04/21/2021 0436   LDLDIRECT 52.0 08/04/2011 1317    Physical Exam:    Wt Readings from Last 3 Encounters:  06/06/21 226 lb 14.4 oz (102.9 kg)  05/26/21 220 lb (99.8 kg)  04/20/21 215 lb (97.5 kg)    VS:  BP (!) 180/92 (BP Location: Right Arm, Patient Position: Sitting, Cuff Size: Large)    Pulse 83    Ht _0  (1.803 m)    Wt 226 lb 14.4 oz (102.9 kg)    SpO2 98%    BMI 31.65 kg/m  , BMI Body mass index is 31.65 kg/m. GENERAL:  Well appearing HEENT: Pupils equal round and reactive, fundi not visualized, oral mucosa unremarkable NECK:  No jugular venous distention, waveform within normal limits, carotid upstroke brisk and symmetric, no bruits, no thyromegaly LUNGS:  Clear to auscultation bilaterally HEART:  RRR.  PMI not displaced or sustained,S1 and S2 within normal limits, no S3, no S4, no clicks, no rubs, no murmurs ABD:  Flat, positive bowel sounds normal in frequency in pitch, no bruits, no rebound, no guarding, no midline pulsatile mass, no hepatomegaly, no splenomegaly EXT:  2 plus pulses throughout, no edema, no cyanosis no clubbing SKIN:  No rashes no nodules NEURO:  Cranial nerves II through XII grossly intact, motor grossly intact throughout PSYCH:  Cognitively intact, oriented to person place and time  ASSESSMENT:    1. Essential hypertension   2. Acute CVA (cerebrovascular accident) ([place])   3. Hyperlipidemia, unspecified hyperlipidemia type    PLAN:    Essential hypertension Blood pressure is very poorly controlled.  He has been intolerant to several medicines.  We will try adding spironolactone 25 mg daily and checking a BMP in a week.  Continue losartan and carvedilol.  I am suspicious that he will end up with resistant hypertension and his blood pressure will remain uncontrolled.  At that time we will need to do a more thorough secondary work-up.  For now, he was given an  advance hypertension clinic  booklet and asked to check his blood pressures twice daily.  He was also enrolled in the PR EP program.  He was encouraged to keep up his exercise and increase to at least 150 minutes and to continue limiting the sodium in his diet.  Acute CVA (cerebrovascular accident) [facility]) Patient was admitted with CVA in the setting of being off his antihypertensives.  He now understands the importance of taking them as prescribed.  Blood pressure goal is less than 130/80.  We are adding spironolactone today.  Continue losartan and carvedilol.  Continue atorvastatin and aspirin.  His 30-day monitor is almost over and I will read it when it is completed.  Hyperlipidemia Continue atorvastatin.  LDL was slightly above goal when checked in January.  However he has since started a plant-based diet.  We will repeat lipids and a CMP at follow-up and consider increasing atorvastatin if his LDL remains greater than 70.  Screening for Secondary Hypertension:   Causes 06/06/2021  Drugs/Herbals Screened     - Comments plant based.  Limiting sodium.  No caffeine.  No EtOH  Renovascular HTN N/A  Sleep Apnea N/A     - Comments no symptoms  Thyroid Disease Screened  Hyperaldosteronism N/A  Pheochromocytoma N/A  Cushing's Syndrome N/A  Hyperparathyroidism N/A  Coarctation of the Aorta Screened     - Comments BP symmetric  Compliance Screened     - Comments Improved    Relevant Labs/Studies: Basic Labs Latest Ref Rng & Units 04/20/2021 07/31/2018 10/28/2012  Sodium 135 - 145 mmol/L 138 138 140  Potassium 3.5 - 5.1 mmol/L 3.7 4.1 3.4(L)  Creatinine 0.61 - 1.24 mg/dL 0.80 0.81 1.0    Thyroid  Latest Ref Rng & Units 04/21/2021 08/04/2011  TSH 0.350 - 4.500 uIU/mL 2.834 0.82    Disposition: FU with APP/PharmD in 1 month for the next 3 months. FU with Kamerin Axford C. Oval Linsey, MD, [facility] in HTN clinic in 4 months.  Medication Adjustments/Labs and Tests Ordered: Current medicines are reviewed at  length with the patient today.  Concerns regarding medicines are outlined above.   Orders Placed This Encounter  Procedures   Basic metabolic panel   Amb Referral To Provider Referral Exercise Program (P.R.E.P)   Meds ordered this encounter  Medications   spironolactone (ALDACTONE) 25 MG tablet    Sig: Take 1 tablet (25 mg total) by mouth daily.    Dispense:  90 tablet    Refill:  1   Patient Instructions  Medication Instructions:  START SPIRONOLACTONE 25 MG DAILY   Labwork: BMET IN 1 WEEK    Testing/Procedures: NONE    Follow-Up: 07/13/2021 at 3:45 with DR Ellsworth will receive a phone call from the PREP exercise and nutrition program to schedule an initial assessment.   Special Instructions:   MONITOR YOUR BLOOD PRESSURE TWICE A DAY, LOG IN THE BOOK PROVIDED. BRING THE BOOK AND YOUR BLOOD PRESSURE MACHINE TO YOUR FOLLOW UP IN 1 MONTH   DASH Eating Plan DASH stands for "Dietary Approaches to Stop Hypertension." The DASH eating plan is a healthy eating plan that has been shown to reduce high blood pressure (hypertension). It may also reduce your risk for type 2 diabetes, heart disease, and stroke. The DASH eating plan may also help with weight loss. What are tips for following this plan?  General guidelines Avoid eating more than 2,300 mg (milligrams) of salt (sodium) a day. If you have hypertension, you may need to reduce your sodium  intake to 1,500 mg a day. Limit alcohol intake to no more than 1 drink a day for nonpregnant women and 2 drinks a day for men. One drink equals 12 oz of beer, 5 oz of wine, or 1 oz of hard liquor. Work with your health care provider to maintain a healthy body weight or to lose weight. Ask what an ideal weight is for you. Get at least 30 minutes of exercise that causes your heart to beat faster (aerobic exercise) most days of the week. Activities may include walking, swimming, or biking. Work with your health care provider or diet and  nutrition specialist (dietitian) to adjust your eating plan to your individual calorie needs. Reading food labels  Check food labels for the amount of sodium per serving. Choose foods with less than 5 percent of the Daily Value of sodium. Generally, foods with less than 300 mg of sodium per serving fit into this eating plan. To find whole grains, look for the word "whole" as the first word in the ingredient list. Shopping Buy products labeled as "low-sodium" or "no salt added." Buy fresh foods. Avoid canned foods and premade or frozen meals. Cooking Avoid adding salt when cooking. Use salt-free seasonings or herbs instead of table salt or sea salt. Check with your health care provider or pharmacist before using salt substitutes. Do not fry foods. Cook foods using healthy methods such as baking, boiling, grilling, and broiling instead. Cook with heart-healthy oils, such as olive, canola, soybean, or sunflower oil. Meal planning Eat a balanced diet that includes: 5 or more servings of fruits and vegetables each day. At each meal, try to fill half of your plate with fruits and vegetables. Up to 6-8 servings of whole grains each day. Less than 6 oz of lean meat, poultry, or fish each day. A 3-oz serving of meat is about the same size as a deck of cards. One egg equals 1 oz. 2 servings of low-fat dairy each day. A serving of nuts, seeds, or beans 5 times each week. Heart-healthy fats. Healthy fats called Omega-3 fatty acids are found in foods such as flaxseeds and coldwater fish, like sardines, salmon, and mackerel. Limit how much you eat of the following: Canned or prepackaged foods. Food that is high in trans fat, such as fried foods. Food that is high in saturated fat, such as fatty meat. Sweets, desserts, sugary drinks, and other foods with added sugar. Full-fat dairy products. Do not salt foods before eating. Try to eat at least 2 vegetarian meals each week. Eat more home-cooked food and  less restaurant, buffet, and fast food. When eating at a restaurant, ask that your food be prepared with less salt or no salt, if possible. What foods are recommended? The items listed may not be a complete list. Talk with your dietitian about what dietary choices are best for you. Grains Whole-grain or whole-wheat bread. Whole-grain or whole-wheat pasta. Carvin rice. [place]. Bulgur. Whole-grain and low-sodium cereals. Pita bread. Low-fat, low-sodium crackers. Whole-wheat flour tortillas. Vegetables Fresh or frozen vegetables (raw, steamed, roasted, or grilled). Low-sodium or reduced-sodium tomato and vegetable juice. Low-sodium or reduced-sodium tomato sauce and tomato paste. Low-sodium or reduced-sodium canned vegetables. Fruits All fresh, dried, or frozen fruit. Canned fruit in natural juice (without added sugar). Meat and other protein foods Skinless chicken or [place]. Ground chicken or [place]. Pork with fat trimmed off. Fish and seafood. Egg whites. Dried beans, peas, or lentils. Unsalted nuts, nut butters, and seeds. Unsalted canned beans.  Lean cuts of beef with fat trimmed off. Low-sodium, lean deli meat. Dairy Low-fat (1%) or fat-free (skim) milk. Fat-free, low-fat, or reduced-fat cheeses. Nonfat, low-sodium ricotta or cottage cheese. Low-fat or nonfat yogurt. Low-fat, low-sodium cheese. Fats and oils Soft margarine without trans fats. Vegetable oil. Low-fat, reduced-fat, or light mayonnaise and salad dressings (reduced-sodium). Canola, safflower, olive, soybean, and sunflower oils. Avocado. Seasoning and other foods Herbs. Spices. Seasoning mixes without salt. Unsalted popcorn and pretzels. Fat-free sweets. What foods are not recommended? The items listed may not be a complete list. Talk with your dietitian about what dietary choices are best for you. Grains Baked goods made with fat, such as croissants, muffins, or some breads. Dry pasta or rice meal packs. Vegetables Creamed  or fried vegetables. Vegetables in a cheese sauce. Regular canned vegetables (not low-sodium or reduced-sodium). Regular canned tomato sauce and paste (not low-sodium or reduced-sodium). Regular tomato and vegetable juice (not low-sodium or reduced-sodium). Angie Fava. Olives. Fruits Canned fruit in a light or heavy syrup. Fried fruit. Fruit in cream or butter sauce. Meat and other protein foods Fatty cuts of meat. Ribs. Fried meat. Berniece Salines. Sausage. Bologna and other processed lunch meats. Salami. Fatback. Hotdogs. Bratwurst. Salted nuts and seeds. Canned beans with added salt. Canned or smoked fish. Whole eggs or egg yolks. Chicken or [place] with skin. Dairy Whole or 2% milk, cream, and half-and-half. Whole or full-fat cream cheese. Whole-fat or sweetened yogurt. Full-fat cheese. Nondairy creamers. Whipped toppings. Processed cheese and cheese spreads. Fats and oils Butter. Stick margarine. Lard. Shortening. Ghee. Bacon fat. Tropical oils, such as coconut, palm kernel, or palm oil. Seasoning and other foods Salted popcorn and pretzels. Onion salt, garlic salt, seasoned salt, table salt, and sea salt. Worcestershire sauce. Tartar sauce. Barbecue sauce. Teriyaki sauce. Soy sauce, including reduced-sodium. Steak sauce. Canned and packaged gravies. Fish sauce. Oyster sauce. Cocktail sauce. Horseradish that you find on the shelf. Ketchup. Mustard. Meat flavorings and tenderizers. Bouillon cubes. Hot sauce and Tabasco sauce. Premade or packaged marinades. Premade or packaged taco seasonings. Relishes. Regular salad dressings. Where to find more information: National Heart, Lung, and [employer]: https://wilson-eaton.com/ American Heart Association: www.heart.org Summary The DASH eating plan is a healthy eating plan that has been shown to reduce high blood pressure (hypertension). It may also reduce your risk for type 2 diabetes, heart disease, and stroke. With the DASH eating plan, you should limit salt (sodium)  intake to 2,300 mg a day. If you have hypertension, you may need to reduce your sodium intake to 1,500 mg a day. When on the DASH eating plan, aim to eat more fresh fruits and vegetables, whole grains, lean proteins, low-fat dairy, and heart-healthy fats. Work with your health care provider or diet and nutrition specialist (dietitian) to adjust your eating plan to your individual calorie needs. This information is not intended to replace advice given to you by your health care provider. Make sure you discuss any questions you have with your health care provider. Document Released: 03/02/2011 Document Revised: 02/23/2017 Document Reviewed: 03/06/2016 Elsevier Patient Education  2020 [place].    I,Mathew Stumpf,acting as a Education administrator for Skeet Latch, MD.,have documented all relevant documentation on the behalf of Skeet Latch, MD,as directed by  Skeet Latch, MD while in the presence of Skeet Latch, MD.  I, [place] Oval Linsey, MD have reviewed all documentation for this visit.  The documentation of the exam, diagnosis, procedures, and orders on 06/06/2021 are all accurate and complete.   Signed, Skeet Latch, MD  06/06/2021 12:19 PM    [facility]

## 2021-06-06 NOTE — Patient Instructions (Signed)
Medication Instructions:  START SPIRONOLACTONE 25 MG DAILY   Labwork: BMET IN 1 WEEK    Testing/Procedures: NONE    Follow-Up: 07/13/2021 at 3:45 with DR [facility]   You will receive a phone call from the PREP exercise and nutrition program to schedule an initial assessment.   Special Instructions:   MONITOR YOUR BLOOD PRESSURE TWICE A DAY, LOG IN THE BOOK PROVIDED. BRING THE BOOK AND YOUR BLOOD PRESSURE MACHINE TO YOUR FOLLOW UP IN 1 MONTH   DASH Eating Plan DASH stands for "Dietary Approaches to Stop Hypertension." The DASH eating plan is a healthy eating plan that has been shown to reduce high blood pressure (hypertension). It may also reduce your risk for type 2 diabetes, heart disease, and stroke. The DASH eating plan may also help with weight loss. What are tips for following this plan?  General guidelines Avoid eating more than 2,300 mg (milligrams) of salt (sodium) a day. If you have hypertension, you may need to reduce your sodium intake to 1,500 mg a day. Limit alcohol intake to no more than 1 drink a day for nonpregnant women and 2 drinks a day for men. One drink equals 12 oz of beer, 5 oz of wine, or 1 oz of hard liquor. Work with your health care provider to maintain a healthy body weight or to lose weight. Ask what an ideal weight is for you. Get at least 30 minutes of exercise that causes your heart to beat faster (aerobic exercise) most days of the week. Activities may include walking, swimming, or biking. Work with your health care provider or diet and nutrition specialist (dietitian) to adjust your eating plan to your individual calorie needs. Reading food labels  Check food labels for the amount of sodium per serving. Choose foods with less than 5 percent of the Daily Value of sodium. Generally, foods with less than 300 mg of sodium per serving fit into this eating plan. To find whole grains, look for the word "whole" as the first word in the ingredient  list. Shopping Buy products labeled as "low-sodium" or "no salt added." Buy fresh foods. Avoid canned foods and premade or frozen meals. Cooking Avoid adding salt when cooking. Use salt-free seasonings or herbs instead of table salt or sea salt. Check with your health care provider or pharmacist before using salt substitutes. Do not fry foods. Cook foods using healthy methods such as baking, boiling, grilling, and broiling instead. Cook with heart-healthy oils, such as olive, canola, soybean, or sunflower oil. Meal planning Eat a balanced diet that includes: 5 or more servings of fruits and vegetables each day. At each meal, try to fill half of your plate with fruits and vegetables. Up to 6-8 servings of whole grains each day. Less than 6 oz of lean meat, poultry, or fish each day. A 3-oz serving of meat is about the same size as a deck of cards. One egg equals 1 oz. 2 servings of low-fat dairy each day. A serving of nuts, seeds, or beans 5 times each week. Heart-healthy fats. Healthy fats called Omega-3 fatty acids are found in foods such as flaxseeds and coldwater fish, like sardines, salmon, and mackerel. Limit how much you eat of the following: Canned or prepackaged foods. Food that is high in trans fat, such as fried foods. Food that is high in saturated fat, such as fatty meat. Sweets, desserts, sugary drinks, and other foods with added sugar. Full-fat dairy products. Do not salt foods before eating. Try to  eat at least 2 vegetarian meals each week. Eat more home-cooked food and less restaurant, buffet, and fast food. When eating at a restaurant, ask that your food be prepared with less salt or no salt, if possible. What foods are recommended? The items listed may not be a complete list. Talk with your dietitian about what dietary choices are best for you. Grains Whole-grain or whole-wheat bread. Whole-grain or whole-wheat pasta. Carrington rice. Orpah Cobb. Bulgur. Whole-grain and  low-sodium cereals. Pita bread. Low-fat, low-sodium crackers. Whole-wheat flour tortillas. Vegetables Fresh or frozen vegetables (raw, steamed, roasted, or grilled). Low-sodium or reduced-sodium tomato and vegetable juice. Low-sodium or reduced-sodium tomato sauce and tomato paste. Low-sodium or reduced-sodium canned vegetables. Fruits All fresh, dried, or frozen fruit. Canned fruit in natural juice (without added sugar). Meat and other protein foods Skinless chicken or [place]. Ground chicken or [place]. Pork with fat trimmed off. Fish and seafood. Egg whites. Dried beans, peas, or lentils. Unsalted nuts, nut butters, and seeds. Unsalted canned beans. Lean cuts of beef with fat trimmed off. Low-sodium, lean deli meat. Dairy Low-fat (1%) or fat-free (skim) milk. Fat-free, low-fat, or reduced-fat cheeses. Nonfat, low-sodium ricotta or cottage cheese. Low-fat or nonfat yogurt. Low-fat, low-sodium cheese. Fats and oils Soft margarine without trans fats. Vegetable oil. Low-fat, reduced-fat, or light mayonnaise and salad dressings (reduced-sodium). Canola, safflower, olive, soybean, and sunflower oils. Avocado. Seasoning and other foods Herbs. Spices. Seasoning mixes without salt. Unsalted popcorn and pretzels. Fat-free sweets. What foods are not recommended? The items listed may not be a complete list. Talk with your dietitian about what dietary choices are best for you. Grains Baked goods made with fat, such as croissants, muffins, or some breads. Dry pasta or rice meal packs. Vegetables Creamed or fried vegetables. Vegetables in a cheese sauce. Regular canned vegetables (not low-sodium or reduced-sodium). Regular canned tomato sauce and paste (not low-sodium or reduced-sodium). Regular tomato and vegetable juice (not low-sodium or reduced-sodium). Rosita Fire. Olives. Fruits Canned fruit in a light or heavy syrup. Fried fruit. Fruit in cream or butter sauce. Meat and other protein foods Fatty cuts of  meat. Ribs. Fried meat. Tomasa Blase. Sausage. Bologna and other processed lunch meats. Salami. Fatback. Hotdogs. Bratwurst. Salted nuts and seeds. Canned beans with added salt. Canned or smoked fish. Whole eggs or egg yolks. Chicken or [place] with skin. Dairy Whole or 2% milk, cream, and half-and-half. Whole or full-fat cream cheese. Whole-fat or sweetened yogurt. Full-fat cheese. Nondairy creamers. Whipped toppings. Processed cheese and cheese spreads. Fats and oils Butter. Stick margarine. Lard. Shortening. Ghee. Bacon fat. Tropical oils, such as coconut, palm kernel, or palm oil. Seasoning and other foods Salted popcorn and pretzels. Onion salt, garlic salt, seasoned salt, table salt, and sea salt. Worcestershire sauce. Tartar sauce. Barbecue sauce. Teriyaki sauce. Soy sauce, including reduced-sodium. Steak sauce. Canned and packaged gravies. Fish sauce. Oyster sauce. Cocktail sauce. Horseradish that you find on the shelf. Ketchup. Mustard. Meat flavorings and tenderizers. Bouillon cubes. Hot sauce and Tabasco sauce. Premade or packaged marinades. Premade or packaged taco seasonings. Relishes. Regular salad dressings. Where to find more information: National Heart, Lung, and Blood Institute: PopSteam.is American Heart Association: www.heart.org Summary The DASH eating plan is a healthy eating plan that has been shown to reduce high blood pressure (hypertension). It may also reduce your risk for type 2 diabetes, heart disease, and stroke. With the DASH eating plan, you should limit salt (sodium) intake to 2,300 mg a day. If you have hypertension, you may need to reduce  your sodium intake to 1,500 mg a day. When on the DASH eating plan, aim to eat more fresh fruits and vegetables, whole grains, lean proteins, low-fat dairy, and heart-healthy fats. Work with your health care provider or diet and nutrition specialist (dietitian) to adjust your eating plan to your individual calorie needs. This  information is not intended to replace advice given to you by your health care provider. Make sure you discuss any questions you have with your health care provider. Document Released: 03/02/2011 Document Revised: 02/23/2017 Document Reviewed: 03/06/2016 Elsevier Patient Education  2020 [employer]

## 2021-06-10 ENCOUNTER — Telehealth: Payer: Self-pay

## 2021-06-10 NOTE — Telephone Encounter (Signed)
Attempted to reach pt reference PREP referral. Unable to leave a message ?Will retry at later date  ?

## 2021-06-15 ENCOUNTER — Other Ambulatory Visit: Payer: Self-pay | Admitting: Cardiology

## 2021-06-15 DIAGNOSIS — R42 Dizziness and giddiness: Secondary | ICD-10-CM

## 2021-06-15 DIAGNOSIS — I639 Cerebral infarction, unspecified: Secondary | ICD-10-CM

## 2021-06-15 DIAGNOSIS — I4891 Unspecified atrial fibrillation: Secondary | ICD-10-CM

## 2021-06-17 DIAGNOSIS — I10 Essential (primary) hypertension: Secondary | ICD-10-CM | POA: Diagnosis not present

## 2021-06-17 LAB — BASIC METABOLIC PANEL
BUN/Creatinine Ratio: 13 (ref 9–20)
BUN: 11 mg/dL (ref 6–24)
CO2: 26 mmol/L (ref 20–29)
Calcium: 9.4 mg/dL (ref 8.7–10.2)
Chloride: 103 mmol/L (ref 96–106)
Creatinine, Ser: 0.82 mg/dL (ref 0.76–1.27)
Glucose: 129 mg/dL — ABNORMAL HIGH (ref 70–99)
Potassium: 4.5 mmol/L (ref 3.5–5.2)
Sodium: 141 mmol/L (ref 134–144)
eGFR: 104 mL/min/{1.73_m2} (ref >59)

## 2021-07-05 ENCOUNTER — Telehealth: Payer: Self-pay | Admitting: Neurology

## 2021-07-05 DIAGNOSIS — I639 Cerebral infarction, unspecified: Secondary | ICD-10-CM

## 2021-07-05 NOTE — Telephone Encounter (Addendum)
Please call the patient. Cardiac monitor was unremarkable, no arrhythmias were reported.  Will refer patient to cardiac EP for consideration of TEE/loop recorder, bilateral cerebellar PICA infarcts, etiology unclear, concerning for cardioembolic source. ?

## 2021-07-05 NOTE — Telephone Encounter (Signed)
I spoke to the patient. He verbalized understanding of the cardiac monitoring findings. He is agreeable to move forward with any other needed tests. ?

## 2021-07-05 NOTE — Telephone Encounter (Signed)
Referral sent to [facility] [place]. ?

## 2021-07-12 ENCOUNTER — Other Ambulatory Visit: Payer: Self-pay | Admitting: Neurology

## 2021-07-12 ENCOUNTER — Telehealth (HOSPITAL_BASED_OUTPATIENT_CLINIC_OR_DEPARTMENT_OTHER): Payer: Self-pay | Admitting: *Deleted

## 2021-07-12 DIAGNOSIS — I639 Cerebral infarction, unspecified: Secondary | ICD-10-CM

## 2021-07-12 NOTE — Progress Notes (Signed)
I spoke to the patient's wife (on [place]). She will have him stop by our office for repeat labs. Operating hours provided to her. ?

## 2021-07-12 NOTE — Progress Notes (Signed)
Please ask patient to come back in to recheck labs that were elevated during acute stroke event. Orders are placed.  ? ?Orders Placed This Encounter  ?Procedures  ? Cardiolipin antibodies, IgG, IgM, IgA  ?  Standing Status:   Future  ?  Standing Expiration Date:   07/13/2022  ? ANA  ?  Standing Status:   Future  ?  Standing Expiration Date:   07/13/2022  ? ? ?

## 2021-07-12 NOTE — Telephone Encounter (Signed)
Spoke with patient to reschedule ADV HTN follow up tomorrow ?Per patient his blood pressure has been running 120's-130's since starting new medication ?Advised patient if starts to go up before his follow up 5/24 to call the office, verbalized understanding  ?

## 2021-07-13 ENCOUNTER — Ambulatory Visit (HOSPITAL_BASED_OUTPATIENT_CLINIC_OR_DEPARTMENT_OTHER): Payer: BC Managed Care – PPO | Admitting: Cardiovascular Disease

## 2021-08-17 ENCOUNTER — Ambulatory Visit (HOSPITAL_BASED_OUTPATIENT_CLINIC_OR_DEPARTMENT_OTHER): Payer: BC Managed Care – PPO | Admitting: Cardiovascular Disease

## 2021-10-05 ENCOUNTER — Ambulatory Visit: Payer: BC Managed Care – PPO | Admitting: Neurology

## 2021-10-05 ENCOUNTER — Encounter: Payer: Self-pay | Admitting: Neurology

## 2021-10-05 NOTE — Progress Notes (Deleted)
PATIENT: Tom Lee DOB: Nov 26, 1965  REASON FOR VISIT: Stroke follow-up  HISTORY FROM: Patient  HISTORY OF PRESENT ILLNESS: Today 10/05/21 SS:    Initial Visit 05/26/2021 SS: Tom Lee here today for stroke follow-up from hospitalization April 20, 2021.  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 Is currently wearing heart monitor, is 20 days in. When d/c he had episodic dizziness, this was baseline, improved a lot. On day of ER visit the dizziness, would feel tingles down his legs. Would sit down and fade off. No further spells since d/c. BP up today, just took his medications 1 hour ago on the way here. BP at home runs 242-683 systolic, 41'D diastolic. 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 Is doing plant based diet now. Was on amlodipine but had cramps in legs, he was off when he had this stroke. After the stroke, he was started on Lisinopril, some cough now. Also on HCTZ now. Seeing PCP tomorrow.  -CTA head and neck showed no significant stenosis, occlusion, or dissection. PICA was identified bilaterally.  Diminutive right vertebral artery, dominant left vertebral artery, a normal variant -2D echo EF 55 to 60% -DVT US: No evidence of DVT -TCD bubble study no PFO -LDL 84 -A1C 5.7 -DAPT for 3 weeks then Aspirin alone -Hypercoagulable work-up (cardiolipin antibodies IgM increased 28, IgA greater than 150; ANA positive, phosphatydalserine IgM increased 46) -Recommended 30 day cardiac monitor to rule out A-fib, outpatient TEE  HISTORY  Copied Tom Lee Note 04/20/2021: HPI: Tom Lee is a 56 y.o. male with past medical history of osteoarthritis, mild childhood asthma, SVT in May 2020, hypertension who had not been taking his antihypertensives (amlodipine) in the past 2 months due to bilateral lower extremity cramps who presents today reporting 2 days of  fluctuating dizziness that affected his vision. He states that this began Monday and he was feeling "sluggish". He states that he did a "detox" with dulcolax and then thought he was just dehydrated. He tried to push water and Pedialyte and then went to bed feeling a little better, but still slightly dizzy. On Tuesday at about 0130 in the morning he woke up to the room spinning and he took his blood pressure. He noted that his BPs read 198/105, 199/110. He then took 33m of his amlodipine in the morning and 162mof amlodipine at night. He retook his blood pressure Tuesday night and got readings of 185/101, 177/95, and 197/105 and decided to come to the ED because of his hypertension and dizziness. He denied headache, unilateral weakness or paresthesias, difficulty finding words, comprehending, talking or slurred speech.  No fever, chills, sore throat, rhinorrhea, dyspnea, chest pain, palpitations, PND, orthopnea, but get occasional lower extremity edema.  He has been getting constipation.  He has been using kratom, but denied abdominal pain, diarrhea, melena or hematochezia.  No dysuria, flank pain, frequency or hematuria.  No polyuria, polyphagia or blurred vision. He is currently not having any dizziness. He does report the last occurrence as 0830 this morning.   REVIEW OF SYSTEMS: Out of a complete 14 system review of symptoms, the patient complains only of the following symptoms, and all other reviewed systems are negative.  See HPI  ALLERGIES: Allergies  Allergen Reactions   Diphenhydramine Hcl     REACTION: increased swelling and hives   Hydrocodone Nausea Only   Diphenhydramine Other (See  Comments)   Escitalopram Oxalate     HOME MEDICATIONS: Outpatient Medications Prior to Visit  Medication Sig Dispense Refill   albuterol (VENTOLIN HFA) 108 (90 BASE) MCG/ACT inhaler Inhale 2 puffs into the lungs every 6 (six) hours as needed. 18 g 1   aspirin EC 81 MG EC tablet Take 1 tablet (81 mg total)  by mouth daily. Swallow whole. 30 tablet 1   atorvastatin (LIPITOR) 40 MG tablet Take 1 tablet (40 mg total) by mouth daily. 30 tablet 1   Blood Pressure KIT 1 each by Does not apply route 2 times daily at 12 noon and 4 pm. 1 kit 0   carvedilol (COREG) 12.5 MG tablet Take 12.5 mg by mouth 2 (two) times daily.     Ibuprofen 200 MG CAPS Take 200 mg by mouth daily as needed (pain).     losartan (COZAAR) 100 MG tablet Take 100 mg by mouth daily.     naproxen sodium (ALEVE) 220 MG tablet Take 440 mg by mouth daily as needed (pain).     spironolactone (ALDACTONE) 25 MG tablet Take 1 tablet (25 mg total) by mouth daily. 90 tablet 1   No facility-administered medications prior to visit.    PAST MEDICAL HISTORY: Past Medical History:  Diagnosis Date   Arthritis    Asthma    dx as a child, no symptoms x years   Hypertension 2011    PAST SURGICAL HISTORY: Past Surgical History:  Procedure Laterality Date   broken ankle  2008   had surgery   HERNIA REPAIR  10/2009    FAMILY HISTORY: Family History  Problem Relation Age of Onset   Heart failure Mother 75   Hypertension Mother        CHF   Stroke Mother        x 2   Diabetes Mother    Hypertension Father    Stroke Brother    Heart attack Neg Hx    Colon cancer Neg Hx    Prostate cancer Neg Hx     SOCIAL HISTORY: Social History   Socioeconomic History   Marital status: Married    Spouse name: Not on file   Number of children: 5   Years of education: Not on file   Highest education level: Not on file  Occupational History   Not on file  Tobacco Use   Smoking status: Former   Smokeless tobacco: Never   Tobacco comments:    11-12 years as of 2012  Substance and Sexual Activity   Alcohol use: No   Drug use: No   Sexual activity: Not on file  Other Topics Concern   Not on file  Social History Narrative   Not on file   Social Determinants of Health   Financial Resource Strain: Low Risk  (06/06/2021)   Overall  Financial Resource Strain (CARDIA)    Difficulty of Paying Living Expenses: Not hard at all  Food Insecurity: No Food Insecurity (06/06/2021)   Hunger Vital Sign    Worried About Running Out of Food in the Last Year: Never true    [place] in the Last Year: Never true  Transportation Needs: No Transportation Needs (06/06/2021)   PRAPARE - Hydrologist (Medical): No    Lack of Transportation (Non-Medical): No  Physical Activity: Insufficiently Active (06/06/2021)   Exercise Vital Sign    Days of Exercise per Week: 2 days    Minutes of Exercise  per Session: 40 min  Stress: Not on file  Social Connections: Not on file  Intimate Partner Violence: Not on file   PHYSICAL EXAM  There were no vitals filed for this visit.  There is no height or weight on file to calculate BMI.  Generalized: Well developed, in no acute distress   Neurological examination  Mentation: Alert oriented to time, place, history taking. Follows all commands speech and language fluent Cranial nerve II-XII: Pupils were equal round reactive to light. Extraocular movements were full, visual field were full on confrontational test. Facial sensation and strength were normal.  Head turning and shoulder shrug  were normal and symmetric. Motor: The motor testing reveals 5 over 5 strength of all 4 extremities. Good symmetric motor tone is noted throughout.  Sensory: Sensory testing is intact to soft touch on all 4 extremities. No evidence of extinction is noted.  Coordination: Cerebellar testing reveals good finger-nose-finger and heel-to-shin bilaterally.  Gait and station: Gait is normal. Tandem gait is normal. Romberg is negative. No drift is seen.  Reflexes: Deep tendon reflexes are symmetric and normal bilaterally.   DIAGNOSTIC DATA (LABS, IMAGING, TESTING) - I reviewed patient records, labs, notes, testing and imaging myself where available.  Lab Results  Component Value Date   WBC  4.2 04/20/2021   HGB 14.2 04/20/2021   HCT 43.1 04/20/2021   MCV 83.7 04/20/2021   PLT 162 04/20/2021      Component Value Date/Time   NA 141 06/17/2021 1319   K 4.5 06/17/2021 1319   CL 103 06/17/2021 1319   CO2 26 06/17/2021 1319   GLUCOSE 129 (H) 06/17/2021 1319   GLUCOSE 97 04/20/2021 0312   BUN 11 06/17/2021 1319   CREATININE 0.82 06/17/2021 1319   CALCIUM 9.4 06/17/2021 1319   PROT 7.4 08/04/2011 1317   ALBUMIN 4.5 08/04/2011 1317   AST 27 08/04/2011 1317   ALT 33 08/04/2011 1317   ALKPHOS 59 08/04/2011 1317   BILITOT 0.7 08/04/2011 1317   GFRNONAA >60 04/20/2021 0312   GFRAA >60 07/31/2018 0706   Lab Results  Component Value Date   CHOL 177 04/21/2021   HDL 35 (L) 04/21/2021   LDLCALC 84 04/21/2021   LDLDIRECT 52.0 08/04/2011   TRIG 292 (H) 04/21/2021   CHOLHDL 5.1 04/21/2021   Lab Results  Component Value Date   HGBA1C 5.7 (H) 04/21/2021   Lab Results  Component Value Date   VITAMINB12 207 04/21/2021   Lab Results  Component Value Date   TSH 2.834 04/21/2021    ASSESSMENT AND PLAN 56 y.o. year old male   1.  Stroke, bilateral cerebellar PICA infarcts -Etiology unclear, concerning for cardioembolic source -No residual deficits or further dizzy spells -Continue aspirin 81 mg daily -In 12 weeks, recheck labs for hypercoagulable workup (positive cardiolipin antibodies IgM, IgA; ANA; phosphatydalserine IgM 46) will send myself reminder (anti-beta 2 glycoprotein, cardiolipin antibodies, lupus panel, ANA with reflex), checking for antiphospholipid sydrome -Wearing cardiac heart monitor now, 20 days in, if negative for abnormal rhythms, order TEE/loop recorder, seeing cardiology, Dr. Oval Linsey in March -Will see back in 3-4 months to ensure complete work up, for any new or concerning stroke symptoms, go to the ER immediately   2.  Hypertension -Not at goal, seeing PCP tomorrow, currently on lisinopril, HCTZ, these are recent changes, was off CCB at time of  stroke due to side effect- keeps log at home, that is close to goal 130-140/80's, just took meds on the way here -  Goal < 130/80  3.  Hyperlipidemia -LDL 84, goal < 70 -Continue Lipitor 40 -On plant based diet now  4. History of SVT -Wearing heart monitor, seeing cardiology   Butler Denmark, AGNP-C, DNP 10/05/2021, 5:29 AM [facility] [address] [place], [place] [address] [phone]

## 2021-11-04 DIAGNOSIS — R7309 Other abnormal glucose: Secondary | ICD-10-CM | POA: Diagnosis not present

## 2021-11-04 DIAGNOSIS — I10 Essential (primary) hypertension: Secondary | ICD-10-CM | POA: Diagnosis not present

## 2021-11-04 DIAGNOSIS — Z125 Encounter for screening for malignant neoplasm of prostate: Secondary | ICD-10-CM | POA: Diagnosis not present

## 2021-11-04 DIAGNOSIS — R3911 Hesitancy of micturition: Secondary | ICD-10-CM | POA: Diagnosis not present

## 2021-11-27 ENCOUNTER — Encounter (HOSPITAL_COMMUNITY): Payer: Self-pay | Admitting: Emergency Medicine

## 2021-11-27 ENCOUNTER — Emergency Department (HOSPITAL_COMMUNITY)
Admission: EM | Admit: 2021-11-27 | Discharge: 2021-11-27 | Disposition: A | Discharge status: Home / Self Care | Payer: BC Managed Care – PPO | Attending: Emergency Medicine | Admitting: Emergency Medicine

## 2021-11-27 ENCOUNTER — Other Ambulatory Visit: Payer: Self-pay

## 2021-11-27 DIAGNOSIS — Z8673 Personal history of transient ischemic attack (TIA), and cerebral infarction without residual deficits: Secondary | ICD-10-CM | POA: Insufficient documentation

## 2021-11-27 DIAGNOSIS — I10 Essential (primary) hypertension: Secondary | ICD-10-CM

## 2021-11-27 DIAGNOSIS — J45909 Unspecified asthma, uncomplicated: Secondary | ICD-10-CM | POA: Insufficient documentation

## 2021-11-27 DIAGNOSIS — R319 Hematuria, unspecified: Secondary | ICD-10-CM | POA: Diagnosis not present

## 2021-11-27 DIAGNOSIS — Z79899 Other long term (current) drug therapy: Secondary | ICD-10-CM | POA: Insufficient documentation

## 2021-11-27 LAB — URINALYSIS, ROUTINE W REFLEX MICROSCOPIC
Bacteria, UA: NONE SEEN
Bilirubin Urine: NEGATIVE
Glucose, UA: NEGATIVE mg/dL
Ketones, ur: NEGATIVE mg/dL
Leukocytes,Ua: NEGATIVE
Nitrite: NEGATIVE
Protein, ur: 100 mg/dL — AB
Specific Gravity, Urine: 1.012 (ref 1.005–1.030)
pH: 8 (ref 5.0–8.0)

## 2021-11-27 LAB — BASIC METABOLIC PANEL
Anion gap: 6 (ref 5–15)
BUN: 10 mg/dL (ref 6–20)
CO2: 29 mmol/L (ref 22–32)
Calcium: 9.2 mg/dL (ref 8.9–10.3)
Chloride: 104 mmol/L (ref 98–111)
Creatinine, Ser: 0.83 mg/dL (ref 0.61–1.24)
GFR, Estimated: >60 mL/min (ref >60)
Glucose, Bld: 110 mg/dL — ABNORMAL HIGH (ref 70–99)
Potassium: 4.2 mmol/L (ref 3.5–5.1)
Sodium: 139 mmol/L (ref 135–145)

## 2021-11-27 LAB — CBC
HCT: 42.3 % (ref 39.0–52.0)
Hemoglobin: 13.8 g/dL (ref 13.0–17.0)
MCH: 27.2 pg (ref 26.0–34.0)
MCHC: 32.6 g/dL (ref 30.0–36.0)
MCV: 83.3 fL (ref 80.0–100.0)
Platelets: 149 10*3/uL — ABNORMAL LOW (ref 150–400)
RBC: 5.08 MIL/uL (ref 4.22–5.81)
RDW: 12.1 % (ref 11.5–15.5)
WBC: 3.5 10*3/uL — ABNORMAL LOW (ref 4.0–10.5)
nRBC: 0 % (ref 0.0–0.2)

## 2021-11-27 NOTE — ED Provider Notes (Signed)
[facility] EMERGENCY DEPARTMENT Provider Note   CSN: [id number] Arrival date & time: 11/27/21  1154     History  Chief Complaint  Patient presents with   Hypertension    Tom Lee is a 56 y.o. male.  The history is provided by the patient and medical records. No language interpreter was used.  Hypertension     56 year-old male with significant history of hypertension, prior CVA, anxiety, asthma who presents with concerns of elevated blood pressure.  Patient report since having a stroke earlier this year he has been checking his blood pressure on a regular basis.  This morning when he checked his blood pressure he noted that his blood pressure was elevated in the 170s.  He rechecked it again and noted that it was in the 357S systolic which concerns him.  Patient however denies having any associate headache, vision changes, neck pain, chest pain, trouble breathing, focal numbness, focal weakness, nausea, vomiting or confusion.  He did recall having an energy drink approximately an hour and half prior to checking his blood pressure.  He also report recently being placed on Flomax for enlarged prostate and have been taking it intermittently for the past several days.  He denies alcohol or tobacco use.  Previously when he had a stroke he developed dizziness.  Denies any dizziness at this time.  Home Medications Prior to Admission medications   Medication Sig Start Date End Date Taking? Authorizing Provider  albuterol (VENTOLIN HFA) 108 (90 BASE) MCG/ACT inhaler Inhale 2 puffs into the lungs every 6 (six) hours as needed. 03/14/12   Colon Branch, MD  aspirin EC 81 MG EC tablet Take 1 tablet (81 mg total) by mouth daily. Swallow whole. 04/22/21   Shelly Coss, MD  atorvastatin (LIPITOR) 40 MG tablet Take 1 tablet (40 mg total) by mouth daily. 04/22/21   Shelly Coss, MD  Blood Pressure KIT 1 each by Does not apply route 2 times daily at 12 noon and 4 pm. 04/21/21    Shelly Coss, MD  carvedilol (COREG) 12.5 MG tablet Take 12.5 mg by mouth 2 (two) times daily. 05/27/21   [provider]  Ibuprofen 200 MG CAPS Take 200 mg by mouth daily as needed (pain).    [provider]  losartan (COZAAR) 100 MG tablet Take 100 mg by mouth daily. 05/27/21   [provider]  naproxen sodium (ALEVE) 220 MG tablet Take 440 mg by mouth daily as needed (pain).    [provider]  spironolactone (ALDACTONE) 25 MG tablet Take 1 tablet (25 mg total) by mouth daily. 06/06/21 09/04/21  Skeet Latch, MD      Allergies    Diphenhydramine hcl, Hydrocodone, Diphenhydramine, and Escitalopram oxalate    Review of Systems   Review of Systems  All other systems reviewed and are negative.   Physical Exam Updated Vital Signs BP (!) 169/100   Pulse 61   Temp 97.6 F (36.4 C) (Oral)   Resp 16   Ht 5' 11" (1.803 m)   Wt 102.5 kg   SpO2 99%   BMI 31.52 kg/m  Physical Exam Vitals and nursing note reviewed.  Constitutional:      General: He is not in acute distress.    Appearance: He is well-developed.  HENT:     Head: Atraumatic.  Eyes:     Extraocular Movements: Extraocular movements intact.     Conjunctiva/sclera: Conjunctivae normal.     Pupils: Pupils are equal,  round, and reactive to light.  Cardiovascular:     Rate and Rhythm: Normal rate and regular rhythm.     Pulses: Normal pulses.     Heart sounds: Normal heart sounds.  Pulmonary:     Effort: Pulmonary effort is normal.     Breath sounds: Normal breath sounds.  Abdominal:     Palpations: Abdomen is soft.  Musculoskeletal:        General: Normal range of motion.     Cervical back: Neck supple.  Skin:    Findings: No rash.  Neurological:     Mental Status: He is alert and oriented to person, place, and time.     GCS: GCS eye subscore is 4. GCS verbal subscore is 5. GCS motor subscore is 6.     Cranial Nerves: Cranial nerves 2-12 are intact.     Sensory: Sensation is  intact.     Motor: Motor function is intact.     Coordination: Coordination is intact.  Psychiatric:        Mood and Affect: Mood normal.     ED Results / Procedures / Treatments   Labs (all labs ordered are listed, but only abnormal results are displayed) Labs Reviewed  BASIC METABOLIC PANEL - Abnormal; Notable for the following components:      Result Value   Glucose, Bld 110 (*)    All other components within normal limits  CBC - Abnormal; Notable for the following components:   WBC 3.5 (*)    Platelets 149 (*)    All other components within normal limits  URINALYSIS, ROUTINE W REFLEX MICROSCOPIC - Abnormal; Notable for the following components:   Hgb urine dipstick MODERATE (*)    Protein, ur 100 (*)    All other components within normal limits  CBG MONITORING, ED    EKG EKG Interpretation  Date/Time:  Sunday November 27 2021 12:05:45 EDT Ventricular Rate:  59 PR Interval:  198 QRS Duration: 100 QT Interval:  400 QTC Calculation: 396 R Axis:   99 Text Interpretation: Sinus bradycardia Rightward axis When compared with ECG of 20-Apr-2021 03:30, PREVIOUS ECG IS PRESENT Confirmed by Octaviano Glow [phone]) on 11/27/2021 12:59:30 PM  Radiology No results found.  Procedures Procedures    Medications Ordered in ED Medications - No data to display  ED Course/ Medical Decision Making/ A&P                           Medical Decision Making Amount and/or Complexity of Data Reviewed Labs: ordered.   BP (!) 161/90   Pulse 62   Temp 97.6 F (36.4 C) (Oral)   Resp 17   Ht 5' 11" (1.803 m)   Wt 102.5 kg   SpO2 98%   BMI 31.52 kg/m   12:53 PM This is a 56 year old male with significant history of hypertension, prior CVA, who presents with concerns of elevated blood pressure.  Patient states since having his stroke back in January of this year due to elevated blood pressure he has been trying to monitor his blood pressure by checking it whenever he remembers, his  blood pressure usually runs between 1 15-7 40 systolic.  However this morning when he checked it it was as high as 262 systolic which concerns him.  He does not endorse any other symptoms aside from elevated blood pressure.  He did not complain of any headache, vision changes, chest pain, focal numbness or Focal weakness.  He did report starting on Flomax for enlarged prostate and has been taking that for the past several days.  He has been compliant with his blood pressure medication.  He did took an energy drink earlier today.  He does not endorse any infectious symptoms.  On exam, patient is well-appearing appears to be in no acute discomfort.  He has no focal neurodeficit on exam.  Vital signs remarkable for blood pressure of 169/100.  I will check some basic labs to ensure no evidence of an organ damage from elevated blood pressure but my suspicion is low.  Patient also noted some urinary discomfort but this is an ongoing situation for the past month states sometimes he has trouble with urination and having weak stream.  Mention that his PCP did check his prostate level and start him on Flomax but states that his prostate level was normal.  1:36 PM Labs EKG was obtained independent viewed interpreted by me.  EKG without concerning arrhythmia or ischemic changes.  Labs overall reassuring.  Urinalysis does shows moderate hemoglobin on urine dipsticks but without signs of urine tract infection.  There is 100 protein in the urine as well.  Patient without any significant CVA tenderness or abdominal tenderness to suggest kidney stone.  Given his urinary discomfort and blood in his urine, I recommend patient to follow-up with urology for outpatient eval of his hematuria.  On recheck his current blood pressure is 053 systolic.  I felt patient is stable to be discharged home.  No evidence of hypertensive emergency.  Return precaution given.  I have consider obtaining abdominal pelvis CT scan but felt it is low  yield in the setting of no significant pain on exam.        Final Clinical Impression(s) / ED Diagnoses Final diagnoses:  Hypertension, unspecified type  Hematuria, unspecified type    Rx / DC Orders ED Discharge Orders     None         Domenic Moras, PA-C 11/27/21 1339    Wyvonnia Dusky, MD 11/27/21 1409

## 2021-11-27 NOTE — Discharge Instructions (Signed)
You have been evaluated for your elevated blood pressure.  Fortunately no concerning findings were noted on today's exam.  You do have some evidence of blood in your urine, it is important for you to follow-up with urology next week for reassessment of your condition.  Continue to take your blood pressure medication as prescribed.  Return if you have any concern.

## 2021-11-27 NOTE — ED Triage Notes (Signed)
Patient arrives by POV with wife c/o hypertension. States he checked his BP this morning after taking his meds and it was over 200 systolic. Wife states patient is moving a little slower than he usually does however he is acting his self. Denies any headache or weakness. Reports some dizziness. No neuro deficits noted in triage.

## 2022-05-09 DIAGNOSIS — D6869 Other thrombophilia: Secondary | ICD-10-CM | POA: Diagnosis not present

## 2022-05-09 DIAGNOSIS — Z125 Encounter for screening for malignant neoplasm of prostate: Secondary | ICD-10-CM | POA: Diagnosis not present

## 2022-05-09 DIAGNOSIS — N529 Male erectile dysfunction, unspecified: Secondary | ICD-10-CM | POA: Diagnosis not present

## 2022-05-09 DIAGNOSIS — Z0000 Encounter for general adult medical examination without abnormal findings: Secondary | ICD-10-CM | POA: Diagnosis not present

## 2022-05-09 DIAGNOSIS — R7303 Prediabetes: Secondary | ICD-10-CM | POA: Diagnosis not present

## 2022-05-09 DIAGNOSIS — I10 Essential (primary) hypertension: Secondary | ICD-10-CM | POA: Diagnosis not present

## 2022-05-09 DIAGNOSIS — F3341 Major depressive disorder, recurrent, in partial remission: Secondary | ICD-10-CM | POA: Diagnosis not present

## 2022-05-09 DIAGNOSIS — E782 Mixed hyperlipidemia: Secondary | ICD-10-CM | POA: Diagnosis not present

## 2022-05-09 DIAGNOSIS — Z8673 Personal history of transient ischemic attack (TIA), and cerebral infarction without residual deficits: Secondary | ICD-10-CM | POA: Diagnosis not present

## 2022-06-28 DIAGNOSIS — I10 Essential (primary) hypertension: Secondary | ICD-10-CM | POA: Diagnosis not present

## 2022-07-18 ENCOUNTER — Telehealth: Payer: Self-pay | Admitting: Neurology

## 2022-07-18 DIAGNOSIS — I639 Cerebral infarction, unspecified: Secondary | ICD-10-CM

## 2022-07-18 NOTE — Telephone Encounter (Signed)
Called and spoke to pt to complete labs and he stated that he plans to come tomorrow at 11am

## 2022-07-18 NOTE — Telephone Encounter (Addendum)
I got a notification that orders had expired that I had ordered last year, please ask him to come by for repeat labs.  If he is not interested then we will stop following up, this was to complete stroke work up.   1.  Stroke, bilateral cerebellar PICA infarcts -Etiology unclear, concerning for cardioembolic source -No residual deficits or further dizzy spells -Continue aspirin 81 mg daily -In 12 weeks, recheck labs for hypercoagulable workup (positive cardiolipin antibodies IgM, IgA; ANA; phosphatydalserine IgM 46) will send myself reminder (anti-beta 2 glycoprotein, cardiolipin antibodies, lupus panel, ANA with reflex), checking for antiphospholipid sydrome -Wearing cardiac heart monitor now, 20 days in, if negative for abnormal rhythms, order TEE/loop recorder, seeing cardiology, Dr. Duke Salvia in March -Will see back in 3-4 months to ensure complete work up, for any new or concerning stroke symptoms, go to the ER immediately

## 2022-07-20 ENCOUNTER — Other Ambulatory Visit (INDEPENDENT_AMBULATORY_CARE_PROVIDER_SITE_OTHER): Payer: Self-pay

## 2022-07-20 DIAGNOSIS — I639 Cerebral infarction, unspecified: Secondary | ICD-10-CM | POA: Diagnosis not present

## 2022-07-20 DIAGNOSIS — Z0289 Encounter for other administrative examinations: Secondary | ICD-10-CM

## 2022-07-20 NOTE — Addendum Note (Signed)
Addended by: Glean Salvo on: 07/20/2022 02:10 PM   Modules accepted: Orders

## 2022-07-20 NOTE — Telephone Encounter (Signed)
Orders Placed This Encounter  Procedures   Cardiolipin antibodies, IgG, IgM, IgA   ANA, IFA (with reflex)   Phosphatidylserine antibodies

## 2022-07-27 ENCOUNTER — Telehealth: Payer: Self-pay | Admitting: Neurology

## 2022-07-27 ENCOUNTER — Other Ambulatory Visit: Payer: Self-pay | Admitting: Neurology

## 2022-07-27 DIAGNOSIS — I639 Cerebral infarction, unspecified: Secondary | ICD-10-CM

## 2022-07-27 LAB — PHOSPHATIDYLSERINE ANTIBODIES
Antiphosphatidylserine IgA: 48 APS Units — ABNORMAL HIGH (ref 0–19)
Antiphosphatidylserine IgG: 9 Units (ref 0–30)
Antiphosphatidylserine IgM: 22 Units (ref 0–30)

## 2022-07-27 LAB — CARDIOLIPIN ANTIBODIES, IGG, IGM, IGA
Anticardiolipin IgA: 108 APL U/mL — ABNORMAL HIGH (ref 0–11)
Anticardiolipin IgG: <9 GPL U/mL (ref 0–14)
Anticardiolipin IgM: <9 MPL U/mL (ref 0–12)

## 2022-07-27 LAB — ANTINUCLEAR ANTIBODIES, IFA: ANA Titer 1: NEGATIVE

## 2022-07-27 NOTE — Telephone Encounter (Signed)
I tried to call, I left a message. Please try to reach him. 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 Continued positive anticardiolipin IgA 108 (was > 150 at time of acute stroke). Antiphosphatidylserine IgA elevated at 48. I also talked with Dr. Pearlean Brownie, order placed for TEE and referral to EP for consider of Loop recorder. Cardiac monitor was unremarkable. Can we schedule him for revisit with me at the next available in a few months. Thanks  Orders Placed This Encounter  Procedures   Ambulatory referral to Hematology / Oncology

## 2022-07-31 ENCOUNTER — Telehealth: Payer: Self-pay | Admitting: Hematology and Oncology

## 2022-07-31 NOTE — Telephone Encounter (Signed)
scheduled per referral, pt has been called and confirmed date and time. Pt is aware of location and to arrive early for check in   

## 2022-08-09 NOTE — Telephone Encounter (Signed)
Called and left VM per DPR. Please schedule if patient returns call

## 2022-09-12 NOTE — Progress Notes (Signed)
No show

## 2022-09-14 ENCOUNTER — Inpatient Hospital Stay: Payer: BC Managed Care – PPO | Attending: Hematology and Oncology | Admitting: Hematology and Oncology

## 2022-09-14 ENCOUNTER — Inpatient Hospital Stay: Payer: BC Managed Care – PPO

## 2023-03-07 ENCOUNTER — Telehealth: Payer: Self-pay | Admitting: *Deleted

## 2023-03-07 NOTE — Telephone Encounter (Signed)
Contacted regarding [facility] referral. He is not interested in participating in the program at this time.
